# Patient Record
Sex: Female | Born: 1935 | Race: White | Hispanic: No | Marital: Married | Smoking: Never smoker
Health system: Southern US, Community
[De-identification: ages and names within clinical notes are randomized; demographics above are authoritative.]

## PROBLEM LIST (undated history)

## (undated) DIAGNOSIS — N393 Stress incontinence (female) (male): Secondary | ICD-10-CM

## (undated) DIAGNOSIS — F329 Major depressive disorder, single episode, unspecified: Secondary | ICD-10-CM

## (undated) DIAGNOSIS — N3941 Urge incontinence: Principal | ICD-10-CM

## (undated) DIAGNOSIS — F419 Anxiety disorder, unspecified: Secondary | ICD-10-CM

## (undated) DIAGNOSIS — I10 Essential (primary) hypertension: Secondary | ICD-10-CM

## (undated) DIAGNOSIS — H35379 Puckering of macula, unspecified eye: Secondary | ICD-10-CM

## (undated) DIAGNOSIS — F32A Depression, unspecified: Secondary | ICD-10-CM

## (undated) DIAGNOSIS — IMO0001 Reserved for inherently not codable concepts without codable children: Secondary | ICD-10-CM

## (undated) DIAGNOSIS — A319 Mycobacterial infection, unspecified: Secondary | ICD-10-CM

## (undated) DIAGNOSIS — R51 Headache: Secondary | ICD-10-CM

## (undated) DIAGNOSIS — N8110 Cystocele, unspecified: Secondary | ICD-10-CM

## (undated) DIAGNOSIS — E785 Hyperlipidemia, unspecified: Secondary | ICD-10-CM

## (undated) DIAGNOSIS — Z86010 Personal history of colonic polyps: Secondary | ICD-10-CM

## (undated) DIAGNOSIS — K219 Gastro-esophageal reflux disease without esophagitis: Secondary | ICD-10-CM

## (undated) DIAGNOSIS — N952 Postmenopausal atrophic vaginitis: Secondary | ICD-10-CM

## (undated) DIAGNOSIS — K449 Diaphragmatic hernia without obstruction or gangrene: Secondary | ICD-10-CM

## (undated) DIAGNOSIS — K5730 Diverticulosis of large intestine without perforation or abscess without bleeding: Secondary | ICD-10-CM

## (undated) HISTORY — DX: Anxiety disorder, unspecified: F41.9

## (undated) HISTORY — DX: Stress incontinence (female) (male): N39.3

## (undated) HISTORY — PX: COLONOSCOPY: SHX174

## (undated) HISTORY — PX: OTHER SURGICAL HISTORY: SHX169

## (undated) HISTORY — DX: Hyperlipidemia, unspecified: E78.5

## (undated) HISTORY — PX: CATARACT EXTRACTION, BILATERAL: SHX1313

## (undated) HISTORY — DX: Depression, unspecified: F32.A

## (undated) HISTORY — DX: Personal history of colonic polyps: Z86.010

## (undated) HISTORY — DX: Urge incontinence: N39.41

## (undated) HISTORY — PX: ESOPHAGOGASTRODUODENOSCOPY: SHX1529

## (undated) HISTORY — DX: Reserved for inherently not codable concepts without codable children: IMO0001

## (undated) HISTORY — PX: REPLACEMENT TOTAL KNEE: SUR1224

## (undated) HISTORY — DX: Diaphragmatic hernia without obstruction or gangrene: K44.9

## (undated) HISTORY — DX: Postmenopausal atrophic vaginitis: N95.2

## (undated) HISTORY — DX: Mycobacterial infection, unspecified: A31.9

## (undated) HISTORY — DX: Diverticulosis of large intestine without perforation or abscess without bleeding: K57.30

## (undated) HISTORY — DX: Major depressive disorder, single episode, unspecified: F32.9

## (undated) HISTORY — DX: Headache: R51

## (undated) HISTORY — DX: Puckering of macula, unspecified eye: H35.379

## (undated) HISTORY — DX: Essential (primary) hypertension: I10

## (undated) HISTORY — PX: BREAST SURGERY: SHX581

## (undated) HISTORY — DX: Gastro-esophageal reflux disease without esophagitis: K21.9

## (undated) HISTORY — PX: KNEE SURGERY: SHX244

## (undated) HISTORY — PX: APPENDECTOMY: SHX54

## (undated) HISTORY — PX: TOTAL ABDOMINAL HYSTERECTOMY W/ BILATERAL SALPINGOOPHORECTOMY: SHX83

## (undated) HISTORY — DX: Cystocele, unspecified: N81.10

---

## 1998-05-19 ENCOUNTER — Other Ambulatory Visit: Admission: RE | Admit: 1998-05-19 | Discharge: 1998-05-19 | Payer: Self-pay | Admitting: Obstetrics & Gynecology

## 1998-10-06 ENCOUNTER — Emergency Department (HOSPITAL_COMMUNITY): Admission: EM | Admit: 1998-10-06 | Discharge: 1998-10-06 | Payer: Self-pay | Admitting: Emergency Medicine

## 1999-07-19 ENCOUNTER — Other Ambulatory Visit: Admission: RE | Admit: 1999-07-19 | Discharge: 1999-07-19 | Payer: Self-pay | Admitting: Obstetrics & Gynecology

## 1999-10-05 ENCOUNTER — Ambulatory Visit (HOSPITAL_COMMUNITY): Admission: RE | Admit: 1999-10-05 | Discharge: 1999-10-05 | Payer: Self-pay | Admitting: Gastroenterology

## 1999-10-05 ENCOUNTER — Encounter: Payer: Self-pay | Admitting: Gastroenterology

## 2000-07-25 ENCOUNTER — Other Ambulatory Visit: Admission: RE | Admit: 2000-07-25 | Discharge: 2000-07-25 | Payer: Self-pay | Admitting: Obstetrics & Gynecology

## 2001-05-18 ENCOUNTER — Emergency Department (HOSPITAL_COMMUNITY): Admission: EM | Admit: 2001-05-18 | Discharge: 2001-05-18 | Payer: Self-pay

## 2001-09-02 ENCOUNTER — Other Ambulatory Visit: Admission: RE | Admit: 2001-09-02 | Discharge: 2001-09-02 | Payer: Self-pay | Admitting: Obstetrics & Gynecology

## 2002-08-01 ENCOUNTER — Ambulatory Visit (HOSPITAL_COMMUNITY): Admission: RE | Admit: 2002-08-01 | Discharge: 2002-08-01 | Payer: Self-pay | Admitting: Orthopedic Surgery

## 2002-08-08 ENCOUNTER — Ambulatory Visit (HOSPITAL_COMMUNITY): Admission: RE | Admit: 2002-08-08 | Discharge: 2002-08-08 | Payer: Self-pay | Admitting: Internal Medicine

## 2002-08-08 ENCOUNTER — Encounter: Payer: Self-pay | Admitting: Internal Medicine

## 2002-09-19 ENCOUNTER — Other Ambulatory Visit: Admission: RE | Admit: 2002-09-19 | Discharge: 2002-09-19 | Payer: Self-pay | Admitting: Obstetrics & Gynecology

## 2003-04-02 ENCOUNTER — Ambulatory Visit (HOSPITAL_BASED_OUTPATIENT_CLINIC_OR_DEPARTMENT_OTHER): Admission: RE | Admit: 2003-04-02 | Discharge: 2003-04-02 | Payer: Self-pay | Admitting: Plastic Surgery

## 2003-04-02 ENCOUNTER — Encounter (INDEPENDENT_AMBULATORY_CARE_PROVIDER_SITE_OTHER): Payer: Self-pay | Admitting: Plastic Surgery

## 2003-04-02 ENCOUNTER — Ambulatory Visit (HOSPITAL_COMMUNITY): Admission: RE | Admit: 2003-04-02 | Discharge: 2003-04-02 | Payer: Self-pay | Admitting: Plastic Surgery

## 2004-02-05 ENCOUNTER — Other Ambulatory Visit: Admission: RE | Admit: 2004-02-05 | Discharge: 2004-02-05 | Payer: Self-pay | Admitting: Obstetrics & Gynecology

## 2004-05-19 ENCOUNTER — Ambulatory Visit: Payer: Self-pay | Admitting: Internal Medicine

## 2004-05-20 ENCOUNTER — Encounter: Admission: RE | Admit: 2004-05-20 | Discharge: 2004-05-20 | Payer: Self-pay | Admitting: Internal Medicine

## 2004-07-29 ENCOUNTER — Ambulatory Visit: Payer: Self-pay | Admitting: Internal Medicine

## 2004-08-05 ENCOUNTER — Ambulatory Visit: Payer: Self-pay | Admitting: Internal Medicine

## 2004-10-25 ENCOUNTER — Ambulatory Visit: Payer: Self-pay | Admitting: Gastroenterology

## 2004-11-01 ENCOUNTER — Ambulatory Visit: Payer: Self-pay | Admitting: Gastroenterology

## 2004-11-23 ENCOUNTER — Encounter (INDEPENDENT_AMBULATORY_CARE_PROVIDER_SITE_OTHER): Payer: Self-pay | Admitting: *Deleted

## 2004-11-23 ENCOUNTER — Ambulatory Visit: Payer: Self-pay | Admitting: Gastroenterology

## 2004-12-02 ENCOUNTER — Ambulatory Visit: Payer: Self-pay | Admitting: Internal Medicine

## 2004-12-30 ENCOUNTER — Ambulatory Visit: Payer: Self-pay | Admitting: Internal Medicine

## 2005-01-13 ENCOUNTER — Ambulatory Visit: Payer: Self-pay | Admitting: Internal Medicine

## 2005-02-27 ENCOUNTER — Ambulatory Visit: Payer: Self-pay | Admitting: Internal Medicine

## 2005-03-09 ENCOUNTER — Ambulatory Visit: Payer: Self-pay | Admitting: Internal Medicine

## 2005-03-28 ENCOUNTER — Encounter: Admission: RE | Admit: 2005-03-28 | Discharge: 2005-03-28 | Payer: Self-pay | Admitting: Specialist

## 2005-03-30 ENCOUNTER — Ambulatory Visit: Payer: Self-pay | Admitting: Internal Medicine

## 2005-05-30 ENCOUNTER — Ambulatory Visit: Payer: Self-pay | Admitting: Internal Medicine

## 2005-06-07 ENCOUNTER — Ambulatory Visit: Payer: Self-pay

## 2005-06-07 ENCOUNTER — Encounter: Payer: Self-pay | Admitting: Cardiology

## 2005-10-04 ENCOUNTER — Ambulatory Visit: Payer: Self-pay | Admitting: Internal Medicine

## 2006-02-05 ENCOUNTER — Ambulatory Visit: Payer: Self-pay | Admitting: Internal Medicine

## 2006-03-27 ENCOUNTER — Ambulatory Visit: Payer: Self-pay | Admitting: Internal Medicine

## 2006-07-25 ENCOUNTER — Ambulatory Visit: Payer: Self-pay | Admitting: Internal Medicine

## 2006-11-20 ENCOUNTER — Ambulatory Visit: Payer: Self-pay | Admitting: Internal Medicine

## 2007-02-14 ENCOUNTER — Telehealth (INDEPENDENT_AMBULATORY_CARE_PROVIDER_SITE_OTHER): Payer: Self-pay | Admitting: *Deleted

## 2007-02-19 DIAGNOSIS — J4520 Mild intermittent asthma, uncomplicated: Secondary | ICD-10-CM | POA: Insufficient documentation

## 2007-02-19 DIAGNOSIS — F329 Major depressive disorder, single episode, unspecified: Secondary | ICD-10-CM | POA: Insufficient documentation

## 2007-02-19 DIAGNOSIS — E785 Hyperlipidemia, unspecified: Secondary | ICD-10-CM

## 2007-02-19 DIAGNOSIS — R519 Headache, unspecified: Secondary | ICD-10-CM | POA: Insufficient documentation

## 2007-02-19 DIAGNOSIS — F411 Generalized anxiety disorder: Secondary | ICD-10-CM | POA: Insufficient documentation

## 2007-02-19 DIAGNOSIS — R51 Headache: Secondary | ICD-10-CM

## 2007-02-22 ENCOUNTER — Ambulatory Visit: Payer: Self-pay | Admitting: Internal Medicine

## 2007-02-22 DIAGNOSIS — IMO0001 Reserved for inherently not codable concepts without codable children: Secondary | ICD-10-CM

## 2007-02-25 LAB — CONVERTED CEMR LAB
ALT: 26 units/L (ref 0–35)
AST: 33 units/L (ref 0–37)
Albumin: 4.1 g/dL (ref 3.5–5.2)
Alkaline Phosphatase: 68 units/L (ref 39–117)
Basophils Absolute: 0 10*3/uL (ref 0.0–0.1)
Basophils Relative: 0.5 % (ref 0.0–1.0)
Bilirubin, Direct: 0.1 mg/dL (ref 0.0–0.3)
Cholesterol: 208 mg/dL (ref 0–200)
Direct LDL: 127.7 mg/dL
Eosinophils Absolute: 0.4 10*3/uL (ref 0.0–0.6)
Eosinophils Relative: 5.5 % — ABNORMAL HIGH (ref 0.0–5.0)
Ferritin: 63.8 ng/mL (ref 10.0–291.0)
HCT: 42.1 % (ref 36.0–46.0)
HDL: 70.9 mg/dL (ref >39.0)
Hemoglobin: 14.5 g/dL (ref 12.0–15.0)
Iron: 108 ug/dL (ref 42–145)
Lymphocytes Relative: 27 % (ref 12.0–46.0)
MCHC: 34.4 g/dL (ref 30.0–36.0)
MCV: 91 fL (ref 78.0–100.0)
Monocytes Absolute: 0.6 10*3/uL (ref 0.2–0.7)
Monocytes Relative: 8 % (ref 3.0–11.0)
Neutro Abs: 4 10*3/uL (ref 1.4–7.7)
Neutrophils Relative %: 59 % (ref 43.0–77.0)
Platelets: 303 10*3/uL (ref 150–400)
RBC: 4.62 M/uL (ref 3.87–5.11)
RDW: 12.7 % (ref 11.5–14.6)
Total Bilirubin: 0.8 mg/dL (ref 0.3–1.2)
Total CHOL/HDL Ratio: 2.9
Total Protein: 6.4 g/dL (ref 6.0–8.3)
Transferrin: 318 mg/dL (ref >=212.0)
Triglycerides: 103 mg/dL (ref 0–149)
VLDL: 21 mg/dL (ref 0–40)
WBC: 6.9 10*3/uL (ref 4.5–10.5)

## 2007-03-15 ENCOUNTER — Telehealth (INDEPENDENT_AMBULATORY_CARE_PROVIDER_SITE_OTHER): Payer: Self-pay | Admitting: *Deleted

## 2007-04-08 ENCOUNTER — Telehealth: Payer: Self-pay | Admitting: Internal Medicine

## 2007-06-17 ENCOUNTER — Telehealth: Payer: Self-pay | Admitting: Internal Medicine

## 2007-06-25 ENCOUNTER — Ambulatory Visit: Payer: Self-pay | Admitting: Internal Medicine

## 2007-06-27 DIAGNOSIS — I10 Essential (primary) hypertension: Secondary | ICD-10-CM | POA: Insufficient documentation

## 2007-07-08 ENCOUNTER — Telehealth: Payer: Self-pay | Admitting: Internal Medicine

## 2007-07-10 ENCOUNTER — Ambulatory Visit: Payer: Self-pay | Admitting: Gastroenterology

## 2007-07-18 ENCOUNTER — Encounter: Payer: Self-pay | Admitting: Internal Medicine

## 2007-07-26 ENCOUNTER — Ambulatory Visit: Payer: Self-pay | Admitting: Internal Medicine

## 2007-08-09 ENCOUNTER — Telehealth: Payer: Self-pay | Admitting: Internal Medicine

## 2007-08-19 ENCOUNTER — Encounter: Payer: Self-pay | Admitting: Internal Medicine

## 2007-08-19 ENCOUNTER — Ambulatory Visit: Payer: Self-pay | Admitting: Gastroenterology

## 2007-08-19 ENCOUNTER — Encounter: Payer: Self-pay | Admitting: Gastroenterology

## 2007-08-19 DIAGNOSIS — Z860100 Personal history of colon polyps, unspecified: Secondary | ICD-10-CM

## 2007-08-19 DIAGNOSIS — K449 Diaphragmatic hernia without obstruction or gangrene: Secondary | ICD-10-CM

## 2007-08-19 DIAGNOSIS — K5730 Diverticulosis of large intestine without perforation or abscess without bleeding: Secondary | ICD-10-CM

## 2007-08-19 HISTORY — DX: Personal history of colonic polyps: Z86.010

## 2007-08-19 HISTORY — DX: Diaphragmatic hernia without obstruction or gangrene: K44.9

## 2007-08-19 HISTORY — DX: Diverticulosis of large intestine without perforation or abscess without bleeding: K57.30

## 2007-08-19 HISTORY — DX: Personal history of colon polyps, unspecified: Z86.0100

## 2007-09-18 ENCOUNTER — Encounter: Payer: Self-pay | Admitting: Internal Medicine

## 2007-09-24 ENCOUNTER — Ambulatory Visit: Payer: Self-pay | Admitting: Gastroenterology

## 2007-10-23 ENCOUNTER — Ambulatory Visit: Payer: Self-pay | Admitting: Internal Medicine

## 2008-01-24 ENCOUNTER — Encounter: Payer: Self-pay | Admitting: Internal Medicine

## 2008-03-02 ENCOUNTER — Telehealth: Payer: Self-pay | Admitting: Gastroenterology

## 2008-03-10 ENCOUNTER — Ambulatory Visit: Payer: Self-pay | Admitting: Internal Medicine

## 2008-04-21 ENCOUNTER — Ambulatory Visit: Payer: Self-pay | Admitting: Internal Medicine

## 2008-05-29 ENCOUNTER — Ambulatory Visit: Payer: Self-pay | Admitting: Internal Medicine

## 2008-06-23 ENCOUNTER — Telehealth: Payer: Self-pay | Admitting: Internal Medicine

## 2008-06-26 ENCOUNTER — Ambulatory Visit: Payer: Self-pay | Admitting: Internal Medicine

## 2008-06-30 ENCOUNTER — Telehealth: Payer: Self-pay | Admitting: Internal Medicine

## 2008-07-07 ENCOUNTER — Encounter: Payer: Self-pay | Admitting: Gastroenterology

## 2008-07-07 ENCOUNTER — Telehealth (INDEPENDENT_AMBULATORY_CARE_PROVIDER_SITE_OTHER): Payer: Self-pay | Admitting: *Deleted

## 2008-07-27 ENCOUNTER — Telehealth: Payer: Self-pay | Admitting: Internal Medicine

## 2008-08-11 ENCOUNTER — Telehealth: Payer: Self-pay | Admitting: Gastroenterology

## 2008-08-18 ENCOUNTER — Encounter: Payer: Self-pay | Admitting: Gastroenterology

## 2008-10-05 ENCOUNTER — Telehealth: Payer: Self-pay | Admitting: Internal Medicine

## 2008-10-06 ENCOUNTER — Telehealth: Payer: Self-pay | Admitting: Internal Medicine

## 2009-01-14 ENCOUNTER — Encounter: Payer: Self-pay | Admitting: Internal Medicine

## 2009-02-02 ENCOUNTER — Encounter: Payer: Self-pay | Admitting: Gastroenterology

## 2009-02-10 ENCOUNTER — Encounter: Payer: Self-pay | Admitting: Internal Medicine

## 2009-04-14 ENCOUNTER — Ambulatory Visit: Payer: Self-pay | Admitting: Internal Medicine

## 2009-04-15 ENCOUNTER — Telehealth: Payer: Self-pay | Admitting: Internal Medicine

## 2009-04-22 ENCOUNTER — Encounter (INDEPENDENT_AMBULATORY_CARE_PROVIDER_SITE_OTHER): Payer: Self-pay | Admitting: *Deleted

## 2009-06-07 ENCOUNTER — Telehealth: Payer: Self-pay | Admitting: Internal Medicine

## 2009-06-10 ENCOUNTER — Encounter: Payer: Self-pay | Admitting: Internal Medicine

## 2009-06-22 ENCOUNTER — Encounter: Payer: Self-pay | Admitting: Internal Medicine

## 2009-08-13 ENCOUNTER — Ambulatory Visit (HOSPITAL_COMMUNITY): Admission: RE | Admit: 2009-08-13 | Discharge: 2009-08-13 | Payer: Self-pay | Admitting: Orthopedic Surgery

## 2009-09-08 ENCOUNTER — Telehealth: Payer: Self-pay | Admitting: Internal Medicine

## 2009-12-08 ENCOUNTER — Ambulatory Visit: Payer: Self-pay | Admitting: Internal Medicine

## 2009-12-10 LAB — CONVERTED CEMR LAB
BUN: 14 mg/dL (ref 6–23)
Basophils Relative: 0.8 % (ref 0.0–3.0)
Chloride: 109 meq/L (ref 96–112)
Eosinophils Absolute: 0.5 10*3/uL (ref 0.0–0.7)
Glucose, Bld: 109 mg/dL — ABNORMAL HIGH (ref 70–99)
HCT: 41.5 % (ref 36.0–46.0)
Hemoglobin: 14.3 g/dL (ref 12.0–15.0)
LDL Cholesterol: 90 mg/dL (ref 0–99)
Lymphocytes Relative: 22.7 % (ref 12.0–46.0)
MCHC: 34.4 g/dL (ref 30.0–36.0)
Neutro Abs: 4.6 10*3/uL (ref 1.4–7.7)
Potassium: 5.5 meq/L — ABNORMAL HIGH (ref 3.5–5.1)
RBC: 4.44 M/uL (ref 3.87–5.11)
TSH: 0.77 microintl units/mL (ref 0.35–5.50)
Total Bilirubin: 0.5 mg/dL (ref 0.3–1.2)
VLDL: 31.6 mg/dL (ref 0.0–40.0)

## 2009-12-17 ENCOUNTER — Ambulatory Visit: Payer: Self-pay | Admitting: Internal Medicine

## 2009-12-21 LAB — CONVERTED CEMR LAB
CO2: 27 meq/L (ref 19–32)
Glucose, Bld: 101 mg/dL — ABNORMAL HIGH (ref 70–99)
Potassium: 4.9 meq/L (ref 3.5–5.1)
Sodium: 143 meq/L (ref 135–145)

## 2010-01-13 ENCOUNTER — Encounter: Payer: Self-pay | Admitting: Gastroenterology

## 2010-01-27 ENCOUNTER — Telehealth: Payer: Self-pay | Admitting: Internal Medicine

## 2010-01-28 ENCOUNTER — Telehealth: Payer: Self-pay | Admitting: *Deleted

## 2010-02-01 ENCOUNTER — Telehealth: Payer: Self-pay | Admitting: Internal Medicine

## 2010-02-01 ENCOUNTER — Ambulatory Visit: Payer: Self-pay | Admitting: Family Medicine

## 2010-02-02 ENCOUNTER — Telehealth (INDEPENDENT_AMBULATORY_CARE_PROVIDER_SITE_OTHER): Payer: Self-pay | Admitting: *Deleted

## 2010-02-02 LAB — CONVERTED CEMR LAB
Albumin: 4.3 g/dL (ref 3.5–5.2)
BUN: 11 mg/dL (ref 6–23)
CO2: 28 meq/L (ref 19–32)
Chloride: 106 meq/L (ref 96–112)
GFR calc non Af Amer: 52.98 mL/min (ref >60)

## 2010-02-07 ENCOUNTER — Telehealth: Payer: Self-pay | Admitting: Internal Medicine

## 2010-02-11 ENCOUNTER — Ambulatory Visit: Payer: Self-pay | Admitting: Internal Medicine

## 2010-02-11 DIAGNOSIS — A319 Mycobacterial infection, unspecified: Secondary | ICD-10-CM | POA: Insufficient documentation

## 2010-02-17 ENCOUNTER — Encounter: Payer: Self-pay | Admitting: Internal Medicine

## 2010-03-31 ENCOUNTER — Encounter
Admission: RE | Admit: 2010-03-31 | Discharge: 2010-03-31 | Payer: Self-pay | Admitting: Physical Medicine and Rehabilitation

## 2010-04-06 ENCOUNTER — Telehealth: Payer: Self-pay | Admitting: Internal Medicine

## 2010-04-22 ENCOUNTER — Encounter
Admission: RE | Admit: 2010-04-22 | Discharge: 2010-04-22 | Payer: Self-pay | Admitting: Physical Medicine and Rehabilitation

## 2010-05-02 ENCOUNTER — Ambulatory Visit: Payer: Self-pay | Admitting: Internal Medicine

## 2010-05-18 ENCOUNTER — Telehealth: Payer: Self-pay | Admitting: Internal Medicine

## 2010-05-19 ENCOUNTER — Ambulatory Visit: Payer: Self-pay | Admitting: Internal Medicine

## 2010-05-23 ENCOUNTER — Encounter
Admission: RE | Admit: 2010-05-23 | Discharge: 2010-05-23 | Payer: Self-pay | Admitting: Physical Medicine and Rehabilitation

## 2010-05-31 ENCOUNTER — Telehealth: Payer: Self-pay | Admitting: Internal Medicine

## 2010-06-09 ENCOUNTER — Encounter: Payer: Self-pay | Admitting: Internal Medicine

## 2010-06-10 ENCOUNTER — Telehealth: Payer: Self-pay | Admitting: Internal Medicine

## 2010-06-15 ENCOUNTER — Telehealth: Payer: Self-pay | Admitting: Gastroenterology

## 2010-06-28 ENCOUNTER — Telehealth: Payer: Self-pay | Admitting: Internal Medicine

## 2010-07-01 ENCOUNTER — Ambulatory Visit
Admission: RE | Admit: 2010-07-01 | Discharge: 2010-07-01 | Payer: Self-pay | Source: Home / Self Care | Attending: Gastroenterology | Admitting: Gastroenterology

## 2010-07-01 DIAGNOSIS — D126 Benign neoplasm of colon, unspecified: Secondary | ICD-10-CM | POA: Insufficient documentation

## 2010-07-01 DIAGNOSIS — K589 Irritable bowel syndrome without diarrhea: Secondary | ICD-10-CM | POA: Insufficient documentation

## 2010-07-01 DIAGNOSIS — K219 Gastro-esophageal reflux disease without esophagitis: Secondary | ICD-10-CM | POA: Insufficient documentation

## 2010-07-09 ENCOUNTER — Encounter: Payer: Self-pay | Admitting: Physical Medicine and Rehabilitation

## 2010-07-19 NOTE — Miscellaneous (Signed)
Summary: Refill Dexilant  Clinical Lists Changes  Medications: Changed medication from KAPIDEX 60 MG CPDR (DEXLANSOPRAZOLE) Take 1 tablet by mouth once a day to DEXILANT 60 MG CPDR (DEXLANSOPRAZOLE) 1 cap 30 min before breakfast - Signed Rx of DEXILANT 60 MG CPDR (DEXLANSOPRAZOLE) 1 cap 30 min before breakfast;  #30 x 3;  Signed;  Entered by: Ashok Cordia RN;  Authorized by: Mardella Layman MD [facility];  Method used: Electronically to [facility] [address] [place], [place]  [id number], Ph: [phone], Fax: [phone]    Prescriptions: DEXILANT 60 MG CPDR (DEXLANSOPRAZOLE) 1 cap 30 min before breakfast  #30 x 3   Entered by:   Ashok Cordia RN   Authorized by:   Mardella Layman MD [facility]   Signed by:   Ashok Cordia RN on 01/13/2010   Method used:   Electronically to        [facility]* (retail)       [address]       [place], [place]  [address]       Ph: [phone]       Fax: [phone]   RxID:   [id number]

## 2010-07-19 NOTE — Letter (Signed)
Summary: [facility]  [facility]   Imported By: Maryln Gottron 07/22/2009 13:57:54  _____________________________________________________________________  External Attachment:    Type:   Image     Comment:   External Document

## 2010-07-19 NOTE — Progress Notes (Signed)
Summary: Pt is needing help with modifying meds to match bacteria med  Phone Note Call from Patient Call back at [phone] cell   Caller: spouse - Maisie Fus Summary of Call: Pt is on a med for bacteria and she is having problems modifying other meds to match the bacteria med. Pls call asap.  Initial call taken by: Lucy Antigua,  April 06, 2010 1:54 PM

## 2010-07-19 NOTE — Progress Notes (Signed)
Summary: refills on zolpidem, bupropion, lyrica and advair to [facility] Note From Pharmacy   Caller: [facility] Reason for Call: Needs renewal Details for Reason: zolpidem, bupropion, lyrica and advair Initial call taken by: Romualdo Bolk, CMA Duncan Dull),  January 28, 2010 8:51 AM  Follow-up for Phone Call        ok  Additional Follow-up for Phone Call Additional follow up Details #1::        Rx faxed to pharmacy Additional Follow-up by: Romualdo Bolk, CMA Duncan Dull),  January 28, 2010 9:39 AM    Prescriptions: LYRICA 150 MG CAPS (PREGABALIN) take one tab two times a day  #180 x 3   Entered by:   Romualdo Bolk, CMA (AAMA)   Authorized by:   Birdie Sons MD   Signed by:   Romualdo Bolk, CMA (AAMA) on 01/28/2010   Method used:   Printed then faxed to ...       [facility]* (retail)             ,          Ph: [phone]       Fax: [phone]   RxID:   [id number] BUDEPRION SR 150 MG TB12 (BUPROPION HCL) Take 1 tablet by mouth once a day  #90 x 3   Entered by:   Romualdo Bolk, CMA (AAMA)   Authorized by:   Birdie Sons MD   Signed by:   Romualdo Bolk, CMA (AAMA) on 01/28/2010   Method used:   Printed then faxed to ...       [facility]* (retail)             ,          Ph: [phone]       Fax: [phone]   RxID:   [id number] ADVAIR DISKUS 250-50 MCG/DOSE AEPB (FLUTICASONE-SALMETEROL) one puff two times a day as directed  #3 x 3   Entered by:   Romualdo Bolk, CMA (AAMA)   Authorized by:   Birdie Sons MD   Signed by:   Romualdo Bolk, CMA (AAMA) on 01/28/2010   Method used:   Printed then faxed to ...       [facility]* (retail)             ,          Ph: [phone]       Fax: [phone]   RxID:   [id number] AMBIEN 10 MG TABS (ZOLPIDEM TARTRATE) Take 1 tablet by mouth every night  #90 x 1   Entered by:   Romualdo Bolk, CMA (AAMA)   Authorized by:   Birdie Sons MD   Signed by:   Romualdo Bolk, CMA  (AAMA) on 01/28/2010   Method used:   Printed then faxed to ...       [facility]* (retail)             ,          Ph: [phone]       Fax: [phone]   RxID:   [id number]

## 2010-07-19 NOTE — Progress Notes (Signed)
Summary: REFILL  Phone Note Refill Request Message from:  Fax from Pharmacy  Refills Requested: Medication #1:  BUDEPRION SR 150 MG TB12 Take 1 tablet by mouth once a day [facility] FAX----[phone]  Initial call taken by: Warnell Forester,  September 08, 2009 9:08 AM    Prescriptions: BUDEPRION SR 150 MG TB12 (BUPROPION HCL) Take 1 tablet by mouth once a day  #90 x 0   Entered by:   Kern Reap CMA (AAMA)   Authorized by:   Birdie Sons MD   Signed by:   Kern Reap CMA (AAMA) on 09/08/2009   Method used:   Electronically to        [facility]* (mail-order)             ,          Ph: [phone]       Fax: [phone]   RxID:   [id number]

## 2010-07-19 NOTE — Assessment & Plan Note (Signed)
Summary: 6 MTH ROV // RS   Vital Signs:  Patient profile:   75 year old female Weight:      145 pounds BMI:     26.62 Temp:     97.6 degrees F oral Pulse rate:   82 / minute Pulse rhythm:   regular Resp:     16 per minute BP sitting:   120 / 72  Vitals Entered By: Lynann Beaver CMA (February 11, 2010 11:56 AM) CC: rov Is Patient Diabetic? No Pain Assessment Patient in pain? yes        CC:  rov.  History of Present Illness: Compicated foot surgery with infection---see notes from [facility] bacteria identified--on clarithromycin  she noted marked edema last week--i talked with her on the phone and with leg elevation sxs have resolved  All other systems reviewed and were negative except for chronic fatigue and fibromyalgia  She is quite frustrated with the entire experience related to complications of foot surgery  All other systems reviewed and were negative   Current Medications (verified): 1)  Advair Diskus 250-50 Mcg/dose Aepb (Fluticasone-Salmeterol) .... One Puff Daily 2)  Ambien 10 Mg Tabs (Zolpidem Tartrate) .... Take 1 Tablet By Mouth Every Night 3)  Premarin 0.625 Mg Tabs (Estrogens Conjugated) .... One By Mouth Daily 4)  Vitamin C 500 Mg Tabs (Ascorbic Acid) .... Take 5)  B Complex 100   Tabs (B Complex Vitamins) .... Once Daily 6)  Amlodipine Besylate 5 Mg  Tabs (Amlodipine Besylate) .... Take 1 Tablet By Mouth Once A Day 7)  Dexilant 60 Mg Cpdr (Dexlansoprazole) .Marland Kitchen.. 1 Cap 30 Min Before Breakfast 8)  Budeprion Sr 150 Mg Tb12 (Bupropion Hcl) .... Take 1 Tablet By Mouth Once A Day 9)  Lyrica 100 Mg Caps (Pregabalin) .... 2 Q Hs 10)  Boniva 150 Mg Tabs (Ibandronate Sodium) .... Take One Every Month As Directed 11)  Advil 200 Mg Caps (Ibuprofen) .... One By Mouth Three Times A Day 12)  Vitamin D3 1000 Unit Caps (Cholecalciferol) .... One By Mouth Daily 13)  Vitamin B-1 100 Mg Tabs (Thiamine Hcl) .... One By Mouth Daily 14)  Cod Liver Oil  Caps (Cod Liver Oil) ....  One By Mouth Daily 15)  Furosemide 20 Mg Tabs (Furosemide) .... One By Mouth Daily 16)  Ferrous Sulfate 325 (65 Fe) Mg Tabs (Ferrous Sulfate) .... One By Mouth Daily 17)  Clarithromycin 500 Mg Tabs (Clarithromycin) .... One By Mouth Bid  Allergies (verified): 1)  ! Lisinopril (Lisinopril)  Physical Exam  General:  alert and well-developed.   Head:  normocephalic and atraumatic.   Eyes:  pupils equal and pupils round.   Ears:  R ear normal and L ear normal.   Neck:  No deformities, masses, or tenderness noted. Chest Wall:  No deformities, masses, or tenderness noted. Lungs:  normal respiratory effort and no intercostal retractions.   Heart:  normal rate and regular rhythm.   Abdomen:  soft and non-tender.   Msk:  No deformity or scoliosis noted of thoracic or lumbar spine.   bandage on operative foot Neurologic:  cranial nerves II-XII intact.     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 She has required multiple procedures. she now seems to be getting some better. Is on long-term antibiotics.  Problem # 2:  PEDAL EDEMA (ICD-782.3)  resolved. Her updated medication list for this problem includes     Furosemide 20 Mg Tabs (Furosemide) .Marland KitchenMarland KitchenMarland KitchenMarland Kitchen  One by mouth daily  Problem # 3:  MYALGIA/MYOSITIS NOS (ICD-729.1)  exacerbation of fibromyalgia has been a complication of her prolonged infection and surgical recovery. she thinks she might be doing some better in the past week. Her updated medication list for this problem includes:    Advil 200 Mg Caps (Ibuprofen) ..... One by mouth three times a day    Tramadol Hcl 50 Mg Tabs (Tramadol hcl) .Marland Kitchen... Take 1 tablet by mouth three times a day as needed pain  Problem # 4:  DEPRESSION (ICD-311)  exacerbated by complicated surgical procedures. she will continue current medications. Will not change medications at this time. Her updated medication list for this  problem includes:    Budeprion Sr 150 Mg Tb12 (Bupropion hcl) .Marland Kitchen... Take 1 tablet by mouth once a day  Complete Medication List: 1)  Advair Diskus 250-50 Mcg/dose Aepb (Fluticasone-salmeterol) .... One puff daily 2)  Ambien 10 Mg Tabs (Zolpidem tartrate) .... Take 1 tablet by mouth every night 3)  Premarin 0.625 Mg Tabs (Estrogens conjugated) .... One by mouth daily 4)  Vitamin C 500 Mg Tabs (Ascorbic acid) .... Take 5)  B Complex 100 Tabs (B complex vitamins) .... Once daily 6)  Amlodipine Besylate 5 Mg Tabs (Amlodipine besylate) .... Take 1 tablet by mouth once a day 7)  Dexilant 60 Mg Cpdr (Dexlansoprazole) .Marland Kitchen.. 1 cap 30 min before breakfast 8)  Budeprion Sr 150 Mg Tb12 (Bupropion hcl) .... Take 1 tablet by mouth once a day 9)  Lyrica 100 Mg Caps (Pregabalin) .... 2 q hs 10)  Boniva 150 Mg Tabs (Ibandronate sodium) .... Take one every month as directed 11)  Advil 200 Mg Caps (Ibuprofen) .... One by mouth three times a day 12)  Vitamin D3 1000 Unit Caps (Cholecalciferol) .... One by mouth daily 13)  Vitamin B-1 100 Mg Tabs (Thiamine hcl) .... One by mouth daily 14)  Cod Liver Oil Caps (Cod liver oil) .... One by mouth daily 15)  Furosemide 20 Mg Tabs (Furosemide) .... One by mouth daily 16)  Ferrous Sulfate 325 (65 Fe) Mg Tabs (Ferrous sulfate) .... One by mouth daily 17)  Clarithromycin 500 Mg Tabs (Clarithromycin) .... One by mouth bid 18)  Tramadol Hcl 50 Mg Tabs (Tramadol hcl) .... Take 1 tablet by mouth three times a day as needed pain  Patient Instructions: 1)  . Prescriptions: TRAMADOL HCL 50 MG  TABS (TRAMADOL HCL) Take 1 tablet by mouth three times a day as needed pain  #40 x 1   Entered and Authorized by:   Birdie Sons MD   Signed by:   Birdie Sons MD on 02/11/2010   Method used:   Electronically to        [facility]* (retail)       [address]       [place], [place]  [id number]       Ph: [phone]       Fax: [phone]   RxID:    [id number]

## 2010-07-19 NOTE — Procedures (Signed)
Summary: Upper GI Endoscopy/[facility]   Upper GI [facility]   Imported By: Maryln Gottron 07/01/2009 14:22:24  _____________________________________________________________________  External Attachment:    Type:   Image     Comment:   External Document

## 2010-07-19 NOTE — Progress Notes (Signed)
Summary: wonderful  Phone Note Call from Patient   Caller: husband, Maisie Fus, [phone] Summary of Call: Prozac many years.  Doxy & Prozac potential interaction of heart arrythmia.  Off Prozac June.  Tolerated until recently.  [facility] infectious control Dr. Pleas Patricia  says she can go back on Prozac but would need periodic heart monitoring.  Husband says they are in [facility] & cannot get there now, but he can take her to ER here & have EKG run for adverse effects.  Wants Dr. Cato Mulligan to call. Wants Dr. Cato Mulligan to monitor & make sure nothing is missed.   Initial call taken by: Rudy Jew, RN,  April 06, 2010 3:34 PM  Follow-up for Phone Call        let them know I'll call them tonight Follow-up by: Birdie Sons MD,  April 06, 2010 3:52 PM  Additional Follow-up for Phone Call Additional follow up Details #1::        Mr. Fiorentino says wonderful.   Additional Follow-up by: Rudy Jew, RN,  April 06, 2010 4:08 PM

## 2010-07-19 NOTE — Medication Information (Signed)
Summary: Coverage Approval for [employer]   Coverage Approval for [employer]   Imported By: Maryln Gottron 06/21/2009 14:14:44  _____________________________________________________________________  External Attachment:    Type:   Image     Comment:   External Document

## 2010-07-19 NOTE — Progress Notes (Signed)
Summary: walkin for lab  Phone Note Call from Patient   Caller: walkin Summary of Call: Here saying the Dr. Pleas Patricia at [facility] said to have lab for her kidneys to see if that is causing the swelling, that the clindamycin 500mg  has nothing to do with swelling.   Dr. Abner Greenspan did order renal panel yesterday.  Lab checking.   Initial call taken by: Rudy Jew, RN,  February 02, 2010 1:37 PM  Follow-up for Phone Call        renal now available and completely normal. Please notify patient and her [facility] ID doctor, Dr Pleas Patricia Follow-up by: Danise Edge MD,  February 02, 2010 2:13 PM  Additional Follow-up for Phone Call Additional follow up Details #1::        I informed pt and sent a copy of labs to dr at [facility] Additional Follow-up by: Josph Macho RMA,  February 02, 2010 2:20 PM

## 2010-07-19 NOTE — Consult Note (Signed)
Summary: [facility]  [facility]   Imported By: Maryln Gottron 03/29/2010 14:11:42  _____________________________________________________________________  External Attachment:    Type:   Image     Comment:   External Document

## 2010-07-19 NOTE — Progress Notes (Signed)
Summary: wants to see Dr Cato Mulligan  Phone Note Call from Patient Call back at [phone]   Caller: Patient---triage vm Summary of Call: wants to see dr Kloie Whiting. having feet and leg swelling and bp is elevated. please return call.      [place]---[phone] Initial call taken by: Warnell Forester,  May 18, 2010 9:19 AM  Follow-up for Phone Call        pt's BP was 150/78 last night and 140/78 this morning.  Her legs are swelling and she is not currently on a diuretic.  Told pt that her BP was not critical to be seen today and could wait till tomorrow but would check with Dr Cato Mulligan  Follow-up by: Alfred Levins, CMA,  May 18, 2010 10:02 AM  Additional Follow-up for Phone Call Additional follow up Details #1::        per Dr Cato Mulligan ok to wait till tomorrow, appt scheduled Additional Follow-up by: Alfred Levins, CMA,  May 18, 2010 10:16 AM

## 2010-07-19 NOTE — Progress Notes (Signed)
Summary: Pharmacy call - Rx clarification  Phone Note From Pharmacy   Caller: [facility] Pharmacist Summary of Call: [facility] Pharmacist called with a question ref to Rx for med: Amlodipine...Marland KitchenMarland KitchenMarland Kitchen Adv that they can be reached at  [phone]  or  [phone], option 2.  Reference # [id number]  Initial call taken by: Debbra Riding,  January 27, 2010 11:25 AM  Follow-up for Phone Call        Another MD has put her on clrithromycin two times a day x 30 days and this can interact with amlodipine to potentiate it.  could cause hypotension and cardiac complications.  Wanted you aware. Follow-up by: Gladis Riffle, RN,  January 27, 2010 11:47 AM

## 2010-07-19 NOTE — Progress Notes (Signed)
Summary: Request for OV  Phone Note Call from Patient   Caller: Patient   [phone] Summary of Call: Pt called and left msg on Alvino Chapel, RN's v/m adv that she wanted to come in for an appt.Marland KitchenMarland KitchenMarland KitchenI called pt back and spoke with her, she adv she has bilateral foot edema that has worsened over the past week and seems to be going up into calf of leg... Pt was offered OV with another physician but declined stating that Dr Cato Mulligan knows her best and what has been going on with her and she wants to have OV with Dr Cato Mulligan.... Pt transferred to Triage for further.  Initial call taken by: Debbra Riding,  February 01, 2010 1:46 PM  Follow-up for Phone Call        Scheduled with Dr. Abner Greenspan. Follow-up by: Lynann Beaver CMA,  February 01, 2010 1:52 PM

## 2010-07-19 NOTE — Progress Notes (Signed)
  Phone Note Call from Patient   Summary of Call: i talked with pt yesterday about swelling please review our medications with exactly what she is currently taking---update med list to her current meds. I'll review and decide whether to change meds or see her [phone] Initial call taken by: Birdie Sons MD,  February 07, 2010 9:45 AM    New/Updated Medications: ADVAIR DISKUS 250-50 MCG/DOSE AEPB (FLUTICASONE-SALMETEROL) one puff daily LYRICA 100 MG CAPS (PREGABALIN) 2 q hs ADVIL 200 MG CAPS (IBUPROFEN) one by mouth three times a day VITAMIN D3 1000 UNIT CAPS (CHOLECALCIFEROL) one by mouth daily VITAMIN B-1 100 MG TABS (THIAMINE HCL) one by mouth daily COD LIVER OIL  CAPS (COD LIVER OIL) one by mouth daily FUROSEMIDE 20 MG TABS (FUROSEMIDE) one by mouth daily FERROUS SULFATE 325 (65 FE) MG TABS (FERROUS SULFATE) one by mouth daily  Appended Document:  Meds updated.

## 2010-07-19 NOTE — Assessment & Plan Note (Signed)
Summary: F/U ON MEDS // RS   Vital Signs:  Patient profile:   75 year old female Weight:      142 pounds Temp:     97.7 degrees F oral Pulse rate:   64 / minute Pulse rhythm:   regular BP sitting:   122 / 74  (left arm) Cuff size:   regular  Vitals Entered By: Sydell Axon LPN (May 02, 2010 8:30 AM) CC: follow-up visit on meds, has a sore area under right chin, had a flu shot about 3 weeks ago   CC:  follow-up visit on meds, has a sore area under right chin, and had a flu shot about 3 weeks ago.  Current Problems (verified): 1)  Atypical Mycobacterial Infection  (ICD-031.9) 2)  Pedal Edema  (ICD-782.3) 3)  Hyperkalemia  (ICD-276.7) 4)  Foot Pain  (ICD-729.5) 5)  Hypertension  (ICD-401.9) 6)  Myalgia/myositis Nos  (ICD-729.1) 7)  Hyperlipidemia  (ICD-272.4) 8)  Headache  (ICD-784.0) 9)  Depression  (ICD-311) 10)  Asthma  (ICD-493.90) 11)  Anxiety  (ICD-300.00)  Current Medications (verified): 1)  Advair Diskus 250-50 Mcg/dose Aepb (Fluticasone-Salmeterol) .... One Puff Daily 2)  Ambien 10 Mg Tabs (Zolpidem Tartrate) .... Take 1 Tablet By Mouth Every Night 3)  Premarin 0.625 Mg Tabs (Estrogens Conjugated) .... One By Mouth Daily 4)  Vitamin C 500 Mg Tabs (Ascorbic Acid) .... Take 5)  B Complex 100   Tabs (B Complex Vitamins) .... Once Daily 6)  Amlodipine Besylate 5 Mg  Tabs (Amlodipine Besylate) .... Take 1 Tablet By Mouth Once A Day 7)  Dexilant 60 Mg Cpdr (Dexlansoprazole) .Marland Kitchen.. 1 Cap 30 Min Before Breakfast 8)  Budeprion Sr 150 Mg Tb12 (Bupropion Hcl) .... Take 1 Tablet By Mouth Once A Day 9)  Lyrica 100 Mg Caps (Pregabalin) .... 2 By Mouth At Bedtime As Needed 10)  Boniva 150 Mg Tabs (Ibandronate Sodium) .... Take One Every Month As Directed 11)  Advil 200 Mg Caps (Ibuprofen) .... One By Mouth Three Times A Day 12)  Vitamin D3 1000 Unit Caps (Cholecalciferol) .... One By Mouth Daily 13)  Cod Liver Oil  Caps (Cod Liver Oil) .... One By Mouth Daily 14)  Furosemide 20  Mg Tabs (Furosemide) .... One By Mouth Daily 15)  Ferrous Sulfate 325 (65 Fe) Mg Tabs (Ferrous Sulfate) .... One By Mouth Daily 16)  Clarithromycin 500 Mg Tabs (Clarithromycin) .... One By Mouth Bid 17)  Fluoxetine Hcl 20 Mg  Tabs (Fluoxetine Hcl) .... Take One Tablet By Mouth Every Other Day  Allergies: 1)  ! 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 Take 1 tablet by mouth once a day    Furosemide 20 Mg Tabs (Furosemide) ..... One by mouth daily  BP today: 122/74 Prior BP: 120/72 (02/11/2010)  Labs Reviewed: K+: 4.2 (02/01/2010) Creat: : 1.1 (02/01/2010)   Chol: 200 (12/08/2009)   HDL: 78.50 (12/08/2009)   LDL: 90 (12/08/2009)   TG: 158.0 (12/08/2009)  Problem # 3:  ASTHMA (ICD-493.90) controlled continue current medications  Her updated medication list for this problem includes:    Advair Diskus 250-50 Mcg/dose Aepb (Fluticasone-salmeterol) ..... One puff daily  Pulmonary Functions Reviewed: O2 sat: 97 (02/01/2010)  Problem # 4:  MYALGIA/MYOSITIS  NOS (ICD-729.1) she is doing quite well she uses rare vicodin (maybe 1.5 weekly) Her updated medication list for this problem includes:    Advil 200 Mg Caps (Ibuprofen) ..... One by mouth three times a day  Problem # 5:  ANXIETY (ICD-300.00) doing well I'm not sure she needs the fluoxetine---continue for now.  see me 3 months.  Her updated medication list for this problem includes:    Budeprion Sr 150 Mg Tb12 (Bupropion hcl) .Marland Kitchen... Take 1 tablet by mouth once a day    Fluoxetine Hcl 20 Mg Tabs (Fluoxetine hcl) .Marland Kitchen... Take one tablet by mouth every other day  Complete Medication List: 1)  Advair Diskus 250-50  Mcg/dose Aepb (Fluticasone-salmeterol) .... One puff daily 2)  Ambien 10 Mg Tabs (Zolpidem tartrate) .... Take 1 tablet by mouth every night 3)  Premarin 0.625 Mg Tabs (Estrogens conjugated) .... One by mouth daily 4)  Vitamin C 500 Mg Tabs (Ascorbic acid) .... Take 5)  B Complex 100 Tabs (B complex vitamins) .... Once daily 6)  Amlodipine Besylate 5 Mg Tabs (Amlodipine besylate) .... Take 1 tablet by mouth once a day 7)  Dexilant 60 Mg Cpdr (Dexlansoprazole) .Marland Kitchen.. 1 cap 30 min before breakfast 8)  Budeprion Sr 150 Mg Tb12 (Bupropion hcl) .... Take 1 tablet by mouth once a day 9)  Lyrica 100 Mg Caps (Pregabalin) .... 2 by mouth at bedtime as needed 10)  Boniva 150 Mg Tabs (Ibandronate sodium) .... Take one every month as directed 11)  Advil 200 Mg Caps (Ibuprofen) .... One by mouth three times a day 12)  Vitamin D3 1000 Unit Caps (Cholecalciferol) .... One by mouth daily 13)  Furosemide 20 Mg Tabs (Furosemide) .... One by mouth daily 14)  Ferrous Sulfate 325 (65 Fe) Mg Tabs (Ferrous sulfate) .... One by mouth daily 15)  Clarithromycin 500 Mg Tabs (Clarithromycin) .... One by mouth bid 16)  Fluoxetine Hcl 20 Mg Tabs (Fluoxetine hcl) .... Take one tablet by mouth every other day 17)  Clarithromycin 500 Mg Tabs (Clarithromycin) .Marland Kitchen.. 1  by mouth 2 times daily 18)  Metoclopramide Hcl 10 Mg Tabs (Metoclopramide hcl) .... 1/2-1 by mouth once daily as needed bloating 19)  Hydrocodone-acetaminophen 5-325 Mg Tabs (Hydrocodone-acetaminophen) .... 1/2-1 by mouth once daily as needed for fibromyalgia pain  Other Orders: Tdap => 32yrs IM (16109) Admin 1st Vaccine (60454)  Patient Instructions: 1)  Please schedule a follow-up appointment in 3 months. Prescriptions: HYDROCODONE-ACETAMINOPHEN 5-325 MG TABS (HYDROCODONE-ACETAMINOPHEN) 1/2-1 by mouth once daily as needed for fibromyalgia pain  #20 x 1   Entered and Authorized by:   Birdie Sons MD   Signed by:   Birdie Sons MD on 05/02/2010   Method used:    Print then Give to Patient   RxID:   [id number] METOCLOPRAMIDE HCL 10 MG TABS (METOCLOPRAMIDE HCL) 1/2-1 by mouth once daily as needed bloating  #30 x 1   Entered and Authorized by:   Birdie Sons MD   Signed by:   Birdie Sons MD on 05/02/2010   Method used:   Electronically to        [facility]* (retail)       [address]       [place], [place]  [id number]       Ph: [phone]       Fax: [phone]   RxID:   [id number] FLUOXETINE HCL 20 MG  TABS (FLUOXETINE HCL) Take one tablet by mouth every other day  #90 x  3   Entered and Authorized by:   Birdie Sons MD   Signed by:   Birdie Sons MD on 05/02/2010   Method used:   Historical   RxID:   [id number]    Orders Added: 1)  Tdap => 67yrs IM [14782] 2)  Admin 1st Vaccine [90471] 3)  Est. Patient Level IV [95621]   Immunization History:  Influenza Immunization History:    Influenza:  historical (04/18/2010)  Immunizations Administered:  Tetanus Vaccine:    Vaccine Type: Tdap    Site: left deltoid    Mfr: GlaxoSmithKline    Dose: 0.5 ml    Route: IM    Given by: Sydell Axon LPN    Exp. Date: 04/07/2012    Lot #: [id number]    VIS given: 05/06/08 version given May 02, 2010. Received vaccine at [facility] per patient  Immunization History:  Influenza Immunization History:    Influenza:  Historical (04/18/2010)  Immunizations Administered:  Tetanus Vaccine:    Vaccine Type: Tdap    Site: left deltoid    Mfr: GlaxoSmithKline    Dose: 0.5 ml    Route: IM    Given by: Sydell Axon LPN    Exp. Date: 04/07/2012    Lot #: [id number]    VIS given: 05/06/08 version given May 02, 2010.  Current Allergies (reviewed today): ! LISINOPRIL (LISINOPRIL)

## 2010-07-19 NOTE — Assessment & Plan Note (Signed)
Summary: bilateral leg edema/dm   Vital Signs:  Patient profile:   75 year old female Height:      62 inches (157.48 cm) Weight:      147 pounds (66.82 kg) BMI:     26.98 O2 Sat:      97 % on Room air Temp:     98.0 degrees F (36.67 degrees C) oral Pulse rate:   92 / minute BP sitting:   148 / 88  (left arm) Cuff size:   regular  Vitals Entered By: Josph Macho RMA (February 01, 2010 3:05 PM)  O2 Flow:  Room air CC: Bilateral leg edema X  days/ CF Is Patient Diabetic? No   History of Present Illness: Patient in today c/o b/l lower extremity edema. She describes a year of recurrent surgeries and infection in her right foot. She had her first surgery at [facility] in March of 2010. She had trouble with persistent pain after that surgery and xrays revealed that a bone spur was left at the base of the right great toe so surgery was redone and since then she has had trouble. She had trouble with wound closure and swelling and after multiple evaluations it was discovered that she had an infection with Mycobacterium Chelonae and she was recently started on Clarithromycin for treatment. She had been in a wheelchair much of the last few months and then recently was released to start walking again. Since whe started ambulating about a week ago her feet and legs has begun to swell. Most notable at the end of the day and improved in the am. No CP/palp/SOB/f/c/malasie. She brings in labs taken in June of 2011 at [facility] which were essentially normal except for a mildly elevated potassium at 5.5. She was taking an OTC K supplement which she has subsequently stopped. Did stub her middle toes on the right foot recently resulting in some bruising in those toes, no significant pain  Current Medications (verified): 1)  Advair Diskus 250-50 Mcg/dose Aepb (Fluticasone-Salmeterol) .... One Puff Two Times A Day As Directed 2)  Ambien 10 Mg Tabs (Zolpidem Tartrate) .... Take 1 Tablet By Mouth Every Night 3)   Premarin 0.625 Mg Tabs (Estrogens Conjugated) .... One By Mouth Daily 4)  Vitamin C 500 Mg Tabs (Ascorbic Acid) .... Take 5)  Caltrate 600+d 600-400 Mg-Unit  Tabs (Calcium Carbonate-Vitamin D) .... Two Times A Day 6)  B Complex 100   Tabs (B Complex Vitamins) .... Once Daily 7)  Zomig 5 Mg  Tabs (Zolmitriptan) .Marland Kitchen.. 1 Once Daily As Needed Migraine As Needed 8)  Amlodipine Besylate 5 Mg  Tabs (Amlodipine Besylate) .... Take 1 Tablet By Mouth Once A Day 9)  Dexilant 60 Mg Cpdr (Dexlansoprazole) .Marland Kitchen.. 1 Cap 30 Min Before Breakfast 10)  Multivitamins  Tabs (Multiple Vitamin) .... Once Daily 11)  Budeprion Sr 150 Mg Tb12 (Bupropion Hcl) .... Take 1 Tablet By Mouth Once A Day 12)  Lyrica 150 Mg Caps (Pregabalin) .... Take One Tab Two Times A Day 13)  Flax Seed Oil 1000 Mg Caps (Flaxseed (Linseed)) .... Once Daily 14)  Mirapex 0.25 Mg Tabs (Pramipexole Dihydrochloride) .... As Needed 15)  Boniva 150 Mg Tabs (Ibandronate Sodium) .... Take One Every Month As Directed 16)  Carafate 1 Gm/34ml Susp (Sucralfate) .... Take 2 Teaspoons Before Meals and At Bedtime As Needed May Use Generic 17)  Clarithromycin 500 Mg Tabs (Clarithromycin) .Marland Kitchen.. 1 Tab By Mouth Q 12 Hrs  Allergies (verified): 1)  !  Lisinopril (Lisinopril)  Past History:  Past medical history reviewed for relevance to current acute and chronic problems. Social history (including risk factors) reviewed for relevance to current acute and chronic problems.  Past Medical History: Reviewed history from 06/25/2007 and no changes required. fibromyalgia Asthma Hyperlipidemia Hypertension fibromyalgia Anxiety Depression  Social History: Reviewed history from 02/22/2007 and no changes required. Married Never Smoked Regular exercise-no  Review of Systems      See HPI  Physical Exam  General:  Well-developed,well-nourished,in no acute distress; alert,appropriate and cooperative throughout examination Head:  Normocephalic and atraumatic  without obvious abnormalities. No apparent alopecia or balding. Mouth:  Oral mucosa and oropharynx without lesions or exudates.  Teeth in good repair. Neck:  No deformities, masses, or tenderness noted. Lungs:  Normal respiratory effort, chest expands symmetrically. Lungs are clear to auscultation, no crackles or wheezes. Heart:  Normal rate and regular rhythm. S1 and S2 normal without gallopr, click, rub or other extra sounds.grade 2 /6 systolic murmur.   Abdomen:  Bowel sounds positive,abdomen soft and non-tender without masses, organomegaly or hernias noted. Extremities:  No clubbing, cyanosis, or deformity noted. 1-2 pitting edema b/l Lower extremities. Negative Homan's b/l. Mild ecchymosis noted at base of toes 2-4 on right foot. bandage over base of right great toe C/D/I. Psych:  Cognition and judgment appear intact. Alert and cooperative with normal attention span and concentration. No apparent delusions, illusions, hallucinations   Impression & Recommendations:  Problem # 1:  PEDAL EDEMA (ICD-782.3)  Her updated medication list for this problem includes:    Furosemide 20 Mg Tabs (Furosemide) .Marland Kitchen... 1 tab by mouth once daily x 5d and then as needed edema/ wt gain>3#/24hr. Minimize sodium and place feet up above heart for 15 minutes at least twice daily, report any concerning symptoms  Orders: Prescription Created Electronically [phone])  Problem # 2:  HYPERTENSION (ICD-401.9)  Her updated medication list for this problem includes:    Amlodipine Besylate 5 Mg Tabs (Amlodipine besylate) .Marland Kitchen... Take 1 tablet by mouth once a day    Exforge 5-160 Mg Tabs (Amlodipine besylate-valsartan) .Marland Kitchen... 1 tab by mouth once daily    Furosemide 20 Mg Tabs (Furosemide) .Marland Kitchen... 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  Problem # 4:  HYPERKALEMIA (ICD-276.7)  Orders: TLB-Renal Function Panel (80069-RENAL) Venipuncture (09811) Specimen Handling (91478) Await renal results, sees ID next week and she reports they check a renal and EKG routinely. Will have her seen here the week after that so we may follow her BP and potassium closely  Complete Medication List: 1)  Advair Diskus 250-50 Mcg/dose Aepb (Fluticasone-salmeterol) .... One puff two times a day as directed 2)  Ambien 10 Mg Tabs (Zolpidem tartrate) .... Take 1 tablet by mouth every night 3)  Premarin 0.625 Mg Tabs (Estrogens conjugated) .... One by mouth daily 4)  Vitamin C 500 Mg Tabs (Ascorbic acid) .... Take 5)  Caltrate 600+d 600-400 Mg-unit Tabs (Calcium carbonate-vitamin d) .... Two times a day 6)  B Complex 100 Tabs (B complex vitamins) .... Once daily 7)  Zomig 5 Mg  Tabs (Zolmitriptan) .Marland Kitchen.. 1 once daily as needed migraine as needed 8)  Amlodipine Besylate 5 Mg Tabs (Amlodipine besylate) .... Take 1 tablet by mouth once a day 9)  Dexilant 60 Mg Cpdr (Dexlansoprazole) .Marland Kitchen.. 1 cap 30 min before breakfast 10)  Multivitamins Tabs (Multiple vitamin) .... Once daily 11)  Budeprion Sr 150 Mg Tb12 (Bupropion hcl) .... Take 1 tablet by mouth once a day 12)  Lyrica 150 Mg Caps (Pregabalin) .... Take one tab two times a day 13)  Flax Seed Oil 1000 Mg Caps (Flaxseed (linseed)) .... Once daily 14)  Mirapex 0.25 Mg Tabs (Pramipexole dihydrochloride) .... As needed 15)  Boniva 150 Mg Tabs (Ibandronate sodium) .... Take one every month as directed 16)  Carafate 1 Gm/48ml Susp (Sucralfate) .... Take 2 teaspoons before meals and at bedtime as needed may use generic 17)  Clarithromycin 500 Mg Tabs (Clarithromycin) .Marland Kitchen.. 1  tab by mouth q 12 hrs 18)  Exforge 5-160 Mg Tabs (Amlodipine besylate-valsartan) .Marland Kitchen.. 1 tab by mouth once daily 19)  Furosemide 20 Mg Tabs (Furosemide) .Marland Kitchen.. 1 tab by mouth once daily x 5d and then as needed edema/ wt gain>3#/24hr  Patient Instructions: 1)  Please schedule a follow-up appointment in 2 weeks.  2)  check weights daily 3)  maintain adequate potassium intake 4)  Add Align or other probiotic daily Prescriptions: FUROSEMIDE 20 MG TABS (FUROSEMIDE) 1 tab by mouth once daily x 5d and then as needed edema/ wt gain>3#/24hr  #30 x 1   Entered and Authorized by:   Danise Edge MD   Signed by:   Danise Edge MD on 02/01/2010   Method used:   Electronically to        [facility]* (retail)       [address]       [place], [place]  [id number]       Ph: [phone]       Fax: [phone]   RxID:   [id number] EXFORGE 5-160 MG TABS (AMLODIPINE BESYLATE-VALSARTAN) 1 tab by mouth once daily  #30 x 1   Entered and Authorized by:   Danise Edge MD   Signed by:   Danise Edge MD on 02/01/2010   Method used:   Electronically to        [facility]* (retail)       [address]       [place], [place]  [id number]       Ph: [phone]       Fax: [phone]   RxID:   [id number]   Appended Document: bilateral leg edema/dm call patient. see how she is doing appt next week if not better  Appended Document: bilateral leg edema/dm LMTCB  Appended Document: bilateral leg edema/dm Continues swelling by end of day, but stays down during day with elevation.  Has appt 02/11/10.

## 2010-07-19 NOTE — Assessment & Plan Note (Signed)
Summary: fu per pt/njr   Vital Signs:  Patient profile:   75 year old female Pulse rate:   72 / minute Pulse rhythm:   regular Resp:     12 per minute BP sitting:   114 / 76  (left arm) Cuff size:   regular  Vitals Entered By: Gladis Riffle, RN (December 08, 2009 10:59 AM) CC: FU per pt, wants to update her care--has right foot wrapped and in wheelchair Is Patient Diabetic? No   CC:  FU per pt and wants to update her care--has right foot wrapped and in wheelchair.  History of Present Illness: Struggling with non healing---potentially infecte right foot (spur) she is here with husband she eill get records from [facility]  she denies fever, chills,sweats  has some other aches and pains--she thinks related to fibromyalgia     Preventive Screening-Counseling & Management  Alcohol-Tobacco     Smoking Status: never  Current Medications (verified): 1)  Advair Diskus 250-50 Mcg/dose Aepb (Fluticasone-Salmeterol) .... One Puff Two Times A Day As Directed 2)  Ambien 10 Mg Tabs (Zolpidem Tartrate) .... Take 1 Tablet By Mouth Every Night 3)  Premarin 0.625 Mg Tabs (Estrogens Conjugated) .... One By Mouth Daily 4)  Prozac 20 Mg Caps (Fluoxetine Hcl) .... Take 1 Tablet By Mouth Once A Day 5)  Vitamin C 500 Mg Tabs (Ascorbic Acid) .... Take 6)  Caltrate 600+d 600-400 Mg-Unit  Tabs (Calcium Carbonate-Vitamin D) .... Two Times A Day 7)  B Complex 100   Tabs (B Complex Vitamins) .... Once Daily 8)  Zomig 5 Mg  Tabs (Zolmitriptan) .Marland Kitchen.. 1 Once Daily As Needed Migraine As Needed 9)  Amlodipine Besylate 5 Mg  Tabs (Amlodipine Besylate) .... Take 1 Tablet By Mouth Once A Day 10)  Kapidex 60 Mg Cpdr (Dexlansoprazole) .... Take 1 Tablet By Mouth Once A Day 11)  Diazepam 5 Mg Tabs (Diazepam) .... Take One Talbet Every Day For Restless Leg Syndrome 12)  Multivitamins  Tabs (Multiple Vitamin) .... Once Daily 13)  Budeprion Sr 150 Mg Tb12 (Bupropion Hcl) .... Take 1 Tablet By Mouth Once A Day 14)  Lyrica 150  Mg Caps (Pregabalin) .... Take One Tab Two Times A Day 15)  Flax Seed Oil 1000 Mg Caps (Flaxseed (Linseed)) .... Once Daily 16)  Mirapex 0.25 Mg Tabs (Pramipexole Dihydrochloride) .... As Needed 17)  Boniva 150 Mg Tabs (Ibandronate Sodium) .... Take One Every Month As Directed 18)  Carafate 1 Gm/78ml Susp (Sucralfate) .... Take 2 Teaspoons Before Meals and At Bedtime As Needed May Use Generic  Allergies: 1)  ! Lisinopril (Lisinopril)  Past History:  Past Medical History: Last updated: 06/25/2007 fibromyalgia Asthma Hyperlipidemia Hypertension fibromyalgia Anxiety Depression  Family History: Last updated: 06/25/2007 Family History of Allergy Family History Hypertension-brohter and sister and mother Family History of Stroke M 1st degree relative father age 82 Family History of Cardiovascular disorder-mother CHF  Social History: Last updated: 02/22/2007 Married Never Smoked Regular exercise-no  Risk Factors: Exercise: no (02/22/2007)  Risk Factors: Smoking Status: never (12/08/2009)  Past Surgical History: TAH/BSO L knee surgery Breast implants R toe spur removed Appendectomy Total knee replacement--left Total knee replacement---right 2009 cataract--bilaterally  Physical Exam  General:  Well-developed,well-nourished,in no acute distress; alert,appropriate and cooperative throughout examination Head:  normocephalic and atraumatic.   Eyes:  pupils equal and pupils round.   Ears:  R ear normal and L ear normal.   Neck:  No deformities, masses, or tenderness noted. Chest Wall:  No deformities,  masses, or tenderness noted. Lungs:  Normal respiratory effort, chest expands symmetrically. Lungs are clear to auscultation, no crackles or wheezes. Abdomen:  Bowel sounds positive,abdomen soft and non-tender  Msk:  leg in cast   Impression & Recommendations:  Problem # 1:  HYPERTENSION (ICD-401.9) controlled continue current medications  Her updated medication list  for this problem includes:    Amlodipine Besylate 5 Mg Tabs (Amlodipine besylate) .Marland Kitchen... Take 1 tablet by mouth once a day  BP today: 114/76 Prior BP: 124/84 (04/14/2009)  Labs Reviewed: Chol: 208 (02/22/2007)   HDL: 70.9 (02/22/2007)   LDL: DEL (02/22/2007)   TG: 103 (02/22/2007)  Orders: TLB-BMP (Basic Metabolic Panel-BMET) (80048-METABOL)  Problem # 2:  DEPRESSION (ICD-311) well controlled  even with recent medical setbacks Her updated medication list for this problem includes:    Prozac 20 Mg Caps (Fluoxetine hcl) .Marland Kitchen... Take 1 tablet by mouth once a day    Diazepam 5 Mg Tabs (Diazepam) .Marland Kitchen... Take one talbet every day for restless leg syndrome    Budeprion Sr 150 Mg Tb12 (Bupropion hcl) .Marland Kitchen... Take 1 tablet by mouth once a day  Problem # 3:  ASTHMA (ICD-493.90) no recurrence Her updated medication list for this problem includes:    Advair Diskus 250-50 Mcg/dose Aepb (Fluticasone-salmeterol) ..... One puff two times a day as directed  Problem # 4:  FOOT PAIN (ICD-729.5)  f/u at [facility]  Orders: Venipuncture (52841) T-CRP (C-Reactive Protein) (32440) TLB-CBC Platelet - w/Differential (85025-CBCD) TLB-Sedimentation Rate (ESR) (85652-ESR)  Complete Medication List: 1)  Advair Diskus 250-50 Mcg/dose Aepb (Fluticasone-salmeterol) .... One puff two times a day as directed 2)  Ambien 10 Mg Tabs (Zolpidem tartrate) .... Take 1 tablet by mouth every night 3)  Premarin 0.625 Mg Tabs (Estrogens conjugated) .... One by mouth daily 4)  Prozac 20 Mg Caps (Fluoxetine hcl) .... Take 1 tablet by mouth once a day 5)  Vitamin C 500 Mg Tabs (Ascorbic acid) .... Take 6)  Caltrate 600+d 600-400 Mg-unit Tabs (Calcium carbonate-vitamin d) .... Two times a day 7)  B Complex 100 Tabs (B complex vitamins) .... Once daily 8)  Zomig 5 Mg Tabs (Zolmitriptan) .Marland Kitchen.. 1 once daily as needed migraine as needed 9)  Amlodipine Besylate 5 Mg Tabs (Amlodipine besylate) .... Take 1 tablet by mouth once a day 10)   Kapidex 60 Mg Cpdr (Dexlansoprazole) .... Take 1 tablet by mouth once a day 11)  Diazepam 5 Mg Tabs (Diazepam) .... Take one talbet every day for restless leg syndrome 12)  Multivitamins Tabs (Multiple vitamin) .... Once daily 13)  Budeprion Sr 150 Mg Tb12 (Bupropion hcl) .... Take 1 tablet by mouth once a day 14)  Lyrica 150 Mg Caps (Pregabalin) .... Take one tab two times a day 15)  Flax Seed Oil 1000 Mg Caps (Flaxseed (linseed)) .... Once daily 16)  Mirapex 0.25 Mg Tabs (Pramipexole dihydrochloride) .... As needed 17)  Boniva 150 Mg Tabs (Ibandronate sodium) .... Take one every month as directed 18)  Carafate 1 Gm/54ml Susp (Sucralfate) .... Take 2 teaspoons before meals and at bedtime as needed may use generic  Other Orders: TLB-Lipid Panel (80061-LIPID) TLB-TSH (Thyroid Stimulating Hormone) (84443-TSH) TLB-Hepatic/Liver Function Pnl (80076-HEPATIC)

## 2010-07-21 NOTE — Assessment & Plan Note (Signed)
Summary: bp check, bilateral leg swelling/cdw   Vital Signs:  Patient profile:   75 year old female Weight:      145 pounds Temp:     98.3 degrees F oral Pulse rate:   100 / minute Pulse rhythm:   regular BP sitting:   112 / 72  (left arm) Cuff size:   regular  Vitals Entered By: Alfred Levins, CMA (May 19, 2010 10:17 AM) CC: bilateral leg swelling, bp has been high   CC:  bilateral leg swelling and bp has been high.  History of Present Illness: Concerned with BP--- 3 days ago BP 150s/80s has improved the past 2 days.  She wondered whether related to prozac---self dcd.   ROS: fatigue  Current Medications (verified): 1)  Advair Diskus 250-50 Mcg/dose Aepb (Fluticasone-Salmeterol) .... One Puff Daily 2)  Ambien 10 Mg Tabs (Zolpidem Tartrate) .... Take 1 Tablet By Mouth Every Night 3)  Premarin 0.625 Mg Tabs (Estrogens Conjugated) .... One By Mouth Daily 4)  Vitamin C 500 Mg Tabs (Ascorbic Acid) .... Take 5)  B Complex 100   Tabs (B Complex Vitamins) .... Once Daily 6)  Amlodipine Besylate 5 Mg  Tabs (Amlodipine Besylate) .... Take 1 Tablet By Mouth Once A Day 7)  Dexilant 60 Mg Cpdr (Dexlansoprazole) .Marland Kitchen.. 1 Cap 30 Min Before Breakfast...must Have Office Visit 8)  Budeprion Sr 150 Mg Tb12 (Bupropion Hcl) .... Take 1 Tablet By Mouth Once A Day 9)  Lyrica 100 Mg Caps (Pregabalin) .... 2 By Mouth At Bedtime As Needed 10)  Boniva 150 Mg Tabs (Ibandronate Sodium) .... Take One Every Month As Directed 11)  Advil 200 Mg Caps (Ibuprofen) .... One By Mouth Three Times A Day 12)  Vitamin D3 1000 Unit Caps (Cholecalciferol) .... One By Mouth Daily 13)  Furosemide 20 Mg Tabs (Furosemide) .... One By Mouth Daily 14)  Ferrous Sulfate 325 (65 Fe) Mg Tabs (Ferrous Sulfate) .... One By Mouth Daily 15)  Clarithromycin 500 Mg Tabs (Clarithromycin) .... One By Mouth Bid 16)  Fluoxetine Hcl 20 Mg  Tabs (Fluoxetine Hcl) .... Take One Tablet By Mouth Every Other Day 17)  Clarithromycin 500 Mg   Tabs (Clarithromycin) .Marland Kitchen.. 1  By Mouth 2 Times Daily 18)  Metoclopramide Hcl 10 Mg Tabs (Metoclopramide Hcl) .... 1/2-1 By Mouth Once Daily As Needed Bloating 19)  Hydrocodone-Acetaminophen 5-325 Mg Tabs (Hydrocodone-Acetaminophen) .... 1/2-1 By Mouth Once Daily As Needed For Fibromyalgia Pain  Allergies (verified): 1)  ! Lisinopril (Lisinopril)  Physical Exam  General:  alert and well-developed.   Head:  normocephalic and atraumatic.   Neck:  No deformities, masses, or tenderness noted. Chest Wall:  No deformities, masses, or tenderness noted. Lungs:  normal respiratory effort and no intercostal retractions.   Heart:  normal rate and regular rhythm.     Impression & Recommendations:  Problem # 1:  HYPERTENSION (ICD-401.9) controlled amlodipine might contribute to LE edema.  I think she should stay on the same meds for the time being.  Her updated medication list for this problem includes:    Amlodipine Besylate 5 Mg Tabs (Amlodipine besylate) .Marland Kitchen... Take 1 tablet by mouth once a day    Furosemide 20 Mg Tabs (Furosemide) ..... One by mouth daily as needed for lower extremeity edema  BP today: 112/72 Prior BP: 122/74 (05/02/2010)  Labs Reviewed: K+: 4.2 (02/01/2010) Creat: : 1.1 (02/01/2010)   Chol: 200 (12/08/2009)   HDL: 78.50 (12/08/2009)   LDL: 90 (12/08/2009)   TG:  158.0 (12/08/2009)  Complete Medication List: 1)  Advair Diskus 250-50 Mcg/dose Aepb (Fluticasone-salmeterol) .... One puff daily 2)  Ambien 10 Mg Tabs (Zolpidem tartrate) .... Take 1 tablet by mouth every night 3)  Premarin 0.625 Mg Tabs (Estrogens conjugated) .... One by mouth daily 4)  Vitamin C 500 Mg Tabs (Ascorbic acid) .... Take 5)  B Complex 100 Tabs (B complex vitamins) .... Once daily 6)  Amlodipine Besylate 5 Mg Tabs (Amlodipine besylate) .... Take 1 tablet by mouth once a day 7)  Dexilant 60 Mg Cpdr (Dexlansoprazole) .Marland Kitchen.. 1 cap 30 min before breakfast...must have office visit 8)  Budeprion Sr 150 Mg  Tb12 (Bupropion hcl) .... Take 1 tablet by mouth once a day 9)  Lyrica 100 Mg Caps (Pregabalin) .... 2 by mouth at bedtime as needed 10)  Boniva 150 Mg Tabs (Ibandronate sodium) .... Take one every month as directed 11)  Advil 200 Mg Caps (Ibuprofen) .... One by mouth three times a day 12)  Vitamin D3 1000 Unit Caps (Cholecalciferol) .... One by mouth daily 13)  Furosemide 20 Mg Tabs (Furosemide) .... One by mouth daily as needed for lower extremeity edema 14)  Ferrous Sulfate 325 (65 Fe) Mg Tabs (Ferrous sulfate) .... One by mouth daily 15)  Clarithromycin 500 Mg Tabs (Clarithromycin) .... One by mouth bid 16)  Fluoxetine Hcl 20 Mg Tabs (Fluoxetine hcl) .... Take one tablet by mouth every other day 17)  Clarithromycin 500 Mg Tabs (Clarithromycin) .Marland Kitchen.. 1  by mouth 2 times daily 18)  Metoclopramide Hcl 10 Mg Tabs (Metoclopramide hcl) .... 1/2-1 by mouth once daily as needed bloating 19)  Hydrocodone-acetaminophen 5-325 Mg Tabs (Hydrocodone-acetaminophen) .... 1/2-1 by mouth once daily as needed for fibromyalgia pain  Other Orders: Zoster (Shingles) Vaccine Live [phone]) Admin 1st Vaccine (78295) Prescriptions: FUROSEMIDE 20 MG TABS (FUROSEMIDE) one by mouth daily as needed for lower extremeity edema  #30 x 1   Entered and Authorized by:   Birdie Sons MD   Signed by:   Birdie Sons MD on 05/19/2010   Method used:   Electronically to        [facility]* (retail)       [address]       [place], [place]  [address]       Ph: [phone]       Fax: [phone]   RxID:   [id number]    Orders Added: 1)  Est. Patient Level III [56387] 2)  Zoster (Shingles) Vaccine Live [90736] 3)  Admin 1st Vaccine [56433]   Immunizations Administered:  Zostavax # 1:    Vaccine Type: Zostavax    Site: left arm    Mfr: Merck    Dose: 0.108mL    Route: [place]    Given by: Alfred Levins, CMA    Exp. Date: 02/04/2011    Lot #: [id number]   Immunizations Administered:  Zostavax #  1:    Vaccine Type: Zostavax    Site: left arm    Mfr: Merck    Dose: 0.16mL    Route: [place]    Given by: Alfred Levins, CMA    Exp. Date: 02/04/2011    Lot #: [id number]

## 2010-07-21 NOTE — Progress Notes (Signed)
Summary: diazepam  Phone Note From Pharmacy   Caller: [facility]* Call For: swords  Summary of Call: refill diazepam 5mg  1 by mouth once daily for restless leg syndrome.  Removed from med list on 01/2010 with comment that "pt states she no longer takes" Initial call taken by: Alfred Levins, CMA,  June 10, 2010 2:25 PM  Follow-up for Phone Call        ok to refill #30/ 1 refill Follow-up by: Birdie Sons MD,  June 10, 2010 2:51 PM  Additional Follow-up for Phone Call Additional follow up Details #1::        Phone call completed, Pharmacist called Additional Follow-up by: Alfred Levins, CMA,  June 10, 2010 2:53 PM    New/Updated Medications: DIAZEPAM 5 MG TABS (DIAZEPAM) 1 by mouth once daily for restless leg Prescriptions: DIAZEPAM 5 MG TABS (DIAZEPAM) 1 by mouth once daily for restless leg  #30 x 1   Entered by:   Alfred Levins, CMA   Authorized by:   Birdie Sons MD   Signed by:   Alfred Levins, CMA on 06/10/2010   Method used:   Telephoned to ...       [facility]* (retail)       [address]       [place], [place]  [address]       Ph: [phone]       Fax: [phone]   RxID:   [id number]

## 2010-07-21 NOTE — Assessment & Plan Note (Signed)
Summary: med refill--ch.    History of Present Illness Visit Type: Follow-up Visit Primary GI MD: Sheryn Bison MD FACP FAGA Primary Provider: Birdie Sons, MD  Requesting Provider: na Chief Complaint: Yearly f/u for GERD. Pt states that she is doing good and denies any GI complaints. Pt needs refill on Carfate, Dexilant, and Metoclopramide History of Present Illness:   75 year old Caucasian female with chronic GERD previously manifested by right precordial chest pain. She currently is asymptomatic on Dexilant 60 mg a day with p.r.n. Reglan and Carafate use for acid reflux and IBS. She recently completed a month's treatment with clarithromycin 500 mg twice a day for orthopedic problems. She temporarily had some diarrhea which seemed to have resolved. There is no history of anorexic, weight loss, dysphagia, melena, hematochezia, or hepatobiliary complaints. Previous colonoscopy showed mild diverticulosis but no colon polyps.  Her hypertension is well controlled by Dr. Cato Mulligan and she is on multiple different medications listed and reviewed in her record today.   GI Review of Systems      Denies abdominal pain, acid reflux, belching, bloating, chest pain, dysphagia with liquids, dysphagia with solids, heartburn, loss of appetite, nausea, vomiting, vomiting blood, weight loss, and  weight gain.        Denies anal fissure, black tarry stools, change in bowel habit, constipation, diarrhea, diverticulosis, fecal incontinence, heme positive stool, hemorrhoids, irritable bowel syndrome, jaundice, light color stool, liver problems, rectal bleeding, and  rectal pain.    Current Medications (verified): 1)  Advair Diskus 250-50 Mcg/dose Aepb (Fluticasone-Salmeterol) .... One Puff Daily 2)  Ambien 10 Mg Tabs (Zolpidem Tartrate) .... Take 1 Tablet By Mouth Every Night 3)  Premarin 0.625 Mg Tabs (Estrogens Conjugated) .... One By Mouth Daily 4)  Vitamin C 500 Mg Tabs (Ascorbic Acid) .... Take 5)  B  Complex 100   Tabs (B Complex Vitamins) .... Once Daily 6)  Amlodipine Besylate 5 Mg  Tabs (Amlodipine Besylate) .... Take 1 Tablet By Mouth Once A Day 7)  Dexilant 60 Mg Cpdr (Dexlansoprazole) .Marland Kitchen.. 1 Cap 30 Min Before Breakfast...must Keep Office Visit No More Refills 8)  Budeprion Sr 150 Mg Tb12 (Bupropion Hcl) .... Take 1 Tablet By Mouth Once A Day 9)  Lyrica 100 Mg Caps (Pregabalin) .... 2 By Mouth At Bedtime As Needed 10)  Boniva 150 Mg Tabs (Ibandronate Sodium) .... Take One Every Month As Directed 11)  Advil 200 Mg Caps (Ibuprofen) .... One By Mouth Three Times A Day 12)  Vitamin D3 1000 Unit Caps (Cholecalciferol) .... One By Mouth Daily 13)  Ferrous Sulfate 325 (65 Fe) Mg Tabs (Ferrous Sulfate) .... One By Mouth Daily 14)  Fluoxetine Hcl 20 Mg  Tabs (Fluoxetine Hcl) .... Take One Tablet By Mouth Every Other Day 15)  Metoclopramide Hcl 10 Mg Tabs (Metoclopramide Hcl) .... 1/2-1 By Mouth Once Daily As Needed Bloating 16)  Hydrocodone-Acetaminophen 5-325 Mg Tabs (Hydrocodone-Acetaminophen) .... 1/2-1 By Mouth Once Daily As Needed For Fibromyalgia Pain 17)  Diazepam 5 Mg Tabs (Diazepam) .Marland Kitchen.. 1 By Mouth Once Daily For Restless Leg 18)  Hydrochlorothiazide 25 Mg Tabs (Hydrochlorothiazide) .... One By Mouth Q Day 19)  Carafate 1 Gm/54ml Susp (Sucralfate) .... As Needed  Allergies (verified): 1)  ! Lisinopril (Lisinopril)  Past History:  Past medical, surgical, family and social histories (including risk factors) reviewed for relevance to current acute and chronic problems.  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 TAH/BSO L knee surgery Breast implants R toe  spur removed Appendectomy Total knee replacement--left Total knee replacement---right 2009 cataract--bilaterally  Family History: Reviewed history from 06/30/2010 and no changes required. Family History of Allergy Family History Hypertension-brohter and sister and mother Family History of Stroke M 1st degree relative father age 73 Family History of Cardiovascular disorder-mother CHF Family History of Colon Cancer:  Social History: Reviewed history from 02/22/2007 and no changes required. Retired Married Never Smoked Regular exercise-no  Vital Signs:  Patient profile:   75 year old female Height:      62 inches Weight:      143 pounds BMI:     26.25 BSA:     1.66 Pulse rate:   96 / minute Pulse rhythm:   regular BP sitting:   128 / 76  (left arm) Cuff size:   regular  Vitals Entered By: Ok Anis CMA (July 01, 2010 11:35 AM)  Physical Exam  General:  Well-developed,well-nourished,in no acute distress; alert,appropriate and cooperative throughout examination Head:  Normocephalic and atraumatic without obvious abnormalities. No apparent alopecia or balding. Eyes:  No corneal or conjunctival inflammation noted. EOMI. Perrla. Funduscopic exam benign, without hemorrhages, exudates or papilledema. Vision grossly normal.exam deferred to patient's ophthalmologist.   Abdomen:  Bowel sounds positive,abdomen soft and non-tender without masses, organomegaly or hernias noted. Neurologic:  No cranial nerve deficits noted. Station and gait are normal. Plantar reflexes are down-going bilaterally. DTRs are symmetrical throughout. Sensory, motor and coordinative functions appear intact.   Impression & Recommendations:  Problem # 1:  GERD (ICD-530.81) Assessment Improved continue Dexilant 60 mg a day with p.r.n. Carafate and Reglan use. Standard antireflux maneuvers reviewed.  Problem # 2:  COLONIC POLYPS (ICD-211.3) Assessment: Unchanged colonoscopy followup as per clinical  protocol.  Problem # 3:  HYPERTENSION (ICD-401.9) Assessment: Improved blood pressure today normal at 128/78. She is to continue all her other medications as per primary care.  Problem # 4:  IRRITABLE BOWEL SYNDROME (ICD-564.1) Assessment: Unchanged Trial of probiotics with Align daily for 2 weeks as a clinical test. She has no symptoms currently suspicious for C. difficile infection.  Patient Instructions: 1)  Copy sent to : Birdie Sons, MD  2)  Your prescription(s) have been sent to you pharmacy.  3)  The medication list was reviewed and reconciled.  All changed / newly prescribed medications were explained.  A complete medication list was provided to the patient / caregiver. 4)  Please continue current medications.  5)  Avoid foods high in acid content ( tomatoes, citrus juices, spicy foods) . Avoid eating within 3 to 4 hours of lying down or before exercising. Do not over eat; try smaller more frequent meals. Elevate head of bed four inches when sleeping.  6)  High Fiber, Low Fat  Healthy Eating Plan brochure given.  Prescriptions: METOCLOPRAMIDE HCL 10 MG TABS (METOCLOPRAMIDE HCL) 1/2-1 by mouth once daily as needed bloating  #30 x 1   Entered by:   Harlow Mares CMA (AAMA)   Authorized by:   Mardella Layman MD [facility]   Signed by:   Harlow Mares CMA (AAMA) on 07/01/2010   Method used:   Electronically to        [facility]* (retail)       [address]       [place], [place]  [address]  Ph: [phone]       Fax: [phone]   RxID:   [id number] DEXILANT 60 MG CPDR (DEXLANSOPRAZOLE) 1 cap 30 min before breakfast  #30 x 12   Entered by:   Harlow Mares CMA (AAMA)   Authorized by:   Mardella Layman MD [facility]   Signed by:   Harlow Mares CMA (AAMA) on 07/01/2010   Method used:   Electronically to        [facility]* (retail)       [address]       [place], [place]  [address]       Ph: [phone]       Fax: [phone]   RxID:    [id number] CARAFATE 1 GM/10ML SUSP (SUCRALFATE) as needed  #1260ml x 6   Entered by:   Harlow Mares CMA (AAMA)   Authorized by:   Mardella Layman MD [facility]   Signed by:   Harlow Mares CMA (AAMA) on 07/01/2010   Method used:   Electronically to        [facility]* (retail)       [address]       [place], [place]  [address]       Ph: [phone]       Fax: [phone]   RxID:   [id number]

## 2010-07-21 NOTE — Progress Notes (Signed)
Summary: Medication   Phone Note Call from Patient Call back at Home Phone [phone]   Caller: Patient Call For: Dr. Jarold Motto Reason for Call: Refill Medication Summary of Call: Needs refill on her Dexilant.Marland KitchenMarland KitchenMarland KitchenComputer Sciences Corporation...Marland KitchenMarland KitchenMarland Kitchen Appt. sch'd 07-01-10 Initial call taken by: Karna Christmas,  June 15, 2010 10:49 AM  Follow-up for Phone Call        rx sent. Follow-up by: Harlow Mares CMA Duncan Dull),  June 15, 2010 10:53 AM    New/Updated Medications: DEXILANT 60 MG CPDR (DEXLANSOPRAZOLE) 1 cap 30 min before breakfast...MUST KEEP OFFICE VISIT NO MORE REFILLS Prescriptions: DEXILANT 60 MG CPDR (DEXLANSOPRAZOLE) 1 cap 30 min before breakfast...MUST KEEP OFFICE VISIT NO MORE REFILLS  #30 x 0   Entered by:   Harlow Mares CMA (AAMA)   Authorized by:   Mardella Layman MD [facility]   Signed by:   Harlow Mares CMA (AAMA) on 06/15/2010   Method used:   Electronically to        [facility]* (retail)       [address]       [place], [place]  [address]       Ph: [phone]       Fax: [phone]   RxID:   [id number]

## 2010-07-21 NOTE — Progress Notes (Signed)
Summary: Call A Nurse  Phone Note Outgoing Call   Summary of Call: call patient. see how she is doing  Follow-up for Phone Call        pt feels fine.  Her BP was 148/78 when she went to bed last night.  Pt checked her bp while she was on the phone with me and it was 151/81 pulse 88 but her pulse has been in 90s Follow-up by: Alfred Levins, CMA,  June 01, 2010 8:25 AM  Additional Follow-up for Phone Call Additional follow up Details #1::        per Dr Cato Mulligan monitor bp the rest of the week and through weekend and call Monday with readings.  pt was also instructed to call back with any questions or concerns Additional Follow-up by: Alfred Levins, CMA,  June 01, 2010 8:27 AM     [facility] Triage Call Report Triage Record Num: [id number] Operator: Albertine Grates Patient Name: Christina Hoover Call Date & Time: 05/30/2010 6:32:01PM Patient Phone: [phone] PCP: Valetta Mole. [facility] Patient Gender: Female PCP Fax : [phone] Patient DOB: 06-13-1936 Practice Name: [facility] Reason for Call: BP has been 170/78 12-12. Has fear of having stroke. Denies emergent symptoms. Is out to dinner at time of call of call. Took BP med 1 hour prior to call. Home care advice given. Protocol(s) Used: Hypertension, Diagnosed or Suspected Recommended Outcome per Protocol: Provide Home/Self Care Reason for Outcome: Single elevated blood pressure reading and has questions Care Advice:  ~ 12/

## 2010-07-21 NOTE — Letter (Signed)
Summary: [facility]   Imported By: Maryln Gottron 07/05/2010 15:41:32  _____________________________________________________________________  External Attachment:    Type:   Image     Comment:   External Document

## 2010-07-21 NOTE — Progress Notes (Signed)
  Phone Note Call from Patient   Caller: Patient Call For: Birdie Sons MD Summary of Call: Pt is monitoring BP x 1 month with the highest 172/?         140's/70's every other time but she is feeling dizzy at times. [phone] Initial call taken by: Lynann Beaver CMA AAMA,  June 28, 2010 3:12 PM  Follow-up for Phone Call        add hctz 25 mg by mouth qd Follow-up by: Birdie Sons MD,  June 29, 2010 1:00 PM    New/Updated Medications: HYDROCHLOROTHIAZIDE 25 MG TABS (HYDROCHLOROTHIAZIDE) one by mouth q day Prescriptions: HYDROCHLOROTHIAZIDE 25 MG TABS (HYDROCHLOROTHIAZIDE) one by mouth q day  #30 x 5   Entered by:   Lynann Beaver CMA AAMA   Authorized by:   Birdie Sons MD   Signed by:   Lynann Beaver CMA AAMA on 06/29/2010   Method used:   Electronically to        [facility]* (retail)       [address]       [place], [place]  [address]       Ph: [phone]       Fax: [phone]   RxID:   [id number]  Notifed pt.

## 2010-08-01 ENCOUNTER — Encounter: Payer: Self-pay | Admitting: Internal Medicine

## 2010-08-05 ENCOUNTER — Telehealth: Payer: Self-pay | Admitting: Internal Medicine

## 2010-08-05 NOTE — Telephone Encounter (Signed)
Triage vm---has chest congestion and stuffiness. Bp is ok no fever. She knows that it is bronchitis and would like something called to [place]----[phone]. Pt refused several times to come and see a physician. Please advise.

## 2010-08-09 ENCOUNTER — Other Ambulatory Visit: Payer: Self-pay | Admitting: Internal Medicine

## 2010-08-09 NOTE — Telephone Encounter (Signed)
Per Dr Lovell Sheehan, take mucinex fast max liquid in a dark blue bottle.  Pt was notified on 08/05/10

## 2010-08-10 ENCOUNTER — Other Ambulatory Visit: Payer: Self-pay | Admitting: Internal Medicine

## 2010-08-10 NOTE — Progress Notes (Signed)
No show

## 2010-09-09 NOTE — Progress Notes (Signed)
This encounter was created in error - please disregard.

## 2010-09-21 ENCOUNTER — Telehealth: Payer: Self-pay | Admitting: *Deleted

## 2010-09-21 NOTE — Telephone Encounter (Signed)
Scheduled appt with Dr.Burchette.

## 2010-09-21 NOTE — Telephone Encounter (Signed)
Husband is calling stating pt is very congested in chest and coughing.  Left message for pt to call back for appt.

## 2010-09-22 ENCOUNTER — Encounter: Payer: Self-pay | Admitting: Family Medicine

## 2010-09-22 ENCOUNTER — Ambulatory Visit (INDEPENDENT_AMBULATORY_CARE_PROVIDER_SITE_OTHER): Payer: Medicare Other | Admitting: Family Medicine

## 2010-09-22 VITALS — BP 130/78 | Temp 98.2°F | Ht 62.0 in | Wt 145.0 lb

## 2010-09-22 DIAGNOSIS — J040 Acute laryngitis: Secondary | ICD-10-CM

## 2010-09-22 DIAGNOSIS — J209 Acute bronchitis, unspecified: Secondary | ICD-10-CM

## 2010-09-22 MED ORDER — AZITHROMYCIN 250 MG PO TABS: 250.0000 mg | ORAL_TABLET | Freq: Every day | ORAL | Status: AC

## 2010-09-22 NOTE — Progress Notes (Signed)
  Subjective:    Patient ID: Christina Hoover, female    DOB: 01-May-1936, 75 y.o.   MRN: [id number]  HPI Acute illness. Main symptom is laryngitis past 2-3 days. Minimal nasal symptoms. History of some seasonal allergies. No fever. Denies sore throat. Cough productive yellow sputum past couple days.  She is a nonsmoker. No dyspnea. She has history of asthma and takes Advair regularly. No oropharyngeal rash   Review of Systems  Constitutional: Negative for fever, chills and activity change.  HENT: Positive for voice change. Negative for congestion, sore throat, rhinorrhea and trouble swallowing.   Respiratory: Positive for cough. Negative for shortness of breath and wheezing.   Cardiovascular: Negative for chest pain.       Objective:   Physical Exam  Constitutional: She appears well-developed and well-nourished.  HENT:  Right Ear: External ear normal.  Left Ear: External ear normal.  Mouth/Throat: Oropharynx is clear and moist. No oropharyngeal exudate.  Neck: Neck supple. No thyromegaly present.  Cardiovascular: Normal rate, regular rhythm and normal heart sounds.   Pulmonary/Chest: Effort normal and breath sounds normal. She has no wheezes. She has no rales.  Lymphadenopathy:    She has no cervical adenopathy.          Assessment & Plan:  Acute laryngitis. We explained this is likely viral. Try over-the-counter Mucinex and plenty of fluids. No anabolic this time but consider Zithromax if she has any fever or worsening symptoms

## 2010-09-22 NOTE — Patient Instructions (Signed)
Laryngitis   Laryngitis is an inflammation and swelling of the voice box and the area around it. It may cause your voice to change. You may lose your voice entirely for a short while. The voice box (larynx) is located at the top of the airway to the lungs (windpipe, trachea). It contains the vocal cords. When the vocal cords become inflamed or infected, they swell. This is usually linked with hoarseness. It is also linked with loss of voice. If severe, this condition can block the airway. The problem is most common in late fall, winter, or early spring. With or without treatment, you should be well in 7 to 14 days.   The most common form of laryngitis is usually caused by a virus. It may also be part of a bacterial (germ) infection, or part of a common cold, bronchitis, flu, or pneumonia. People who smoke, have allergies, or strain their voices by yelling, talking, or singing may also develop laryngitis.   Laryngitis often follows or occurs during a viral upper respiratory infection. It usually gets better without treatment. Laryngitis is usually not associated with breathing difficulties. There are forms of laryngitis that happen in children. These forms can lead to serious or fatal respiratory blockage. Croup and epiglotitis (rare) are two of these forms.   Other causes of laryngitis include: laryngeal polyps; laryngeal paralysis (such as Horner Syndrome); malignant (cancerous) tumors (lumps) and changes that lead up to the tumor, allergies, and injury.   SOME COMMON SYMPTOMS OF LARYNGITIS INCLUDE:   Ø Recent or current upper respiratory infection.   Ø Hoarse, low voice and a scratchy, possibly sore throat. You also might lose your voice. You may come down with a fever. You may feel like you have a lump in your throat. You may feel very tired.   Ø Dry cough or the barking cough of croup.   Ø Fever.   Ø Swollen lymph nodes or glands in the neck.   Ø Drooping eyelid on one side (Horner Syndrome).   DIAGNOSIS   A  physical examination is usually all that is necessary for the patient with hoarseness associated with a respiratory tract infection. A patient, particularly a smoker, who has continuing hoarseness, will need to see another caregiver for tests of the throat and upper airway.   PREVENTION   Ø Avoidance of upper respiratory infections during cold and flu season may help. Using good hygienic practices such as hand washing, and avoiding people with respiratory illnesses and crowded close quarters, may also help.   Ø Quitting smoking can help prevent laryngitis, malignancies of the head and neck, and of the lungs.   Ø Do not smoke around or in the home of a child with respiratory problems.   HOME CARE INSTRUCTIONS   Ø Do not use your voice for several days. Either speak very softly or write notes until you can talk normally. Discourage your child from talking out loud.   Ø Give or take plenty of warm drinks to soothe the throat.   Ø Use a cool-mist humidifier (vaporizer) to increase air moisture. This will help relieve the tight feeling in your throat. Hot, steamy showers can also help. Keep the air humid in a child's room.   Ø Do not drink alcohol or smoke until your voice is back to normal. Quit smoking.   Ø Get plenty of rest.   Ø Drink extra fluids, such as water, fruit juice, and tea.   Ø Only take over-the-counter or prescription   medicines for pain, discomfort, or fever as directed by your caregiver.   Ø Your caregiver may prescribe an antibiotic (medications that kill germs) to treat infection caused by bacteria (germs). This will not be done if the infection is viral. Since most common laryngitis is viral, treatment with antibiotics is generally not helpful. Antibiotics do not affect viruses. Their improper use may cause more harm than good.   SEEK IMMEDIATE MEDICAL CARE IF:   Ø There is difficulty breathing, swallowing, or if drooling is present in a small child.   Ø If hoarseness has lasted for more than 1 week  in a child, or 2 weeks in an adult.   Ø You develop a temperature greater than 101. A fever above 100.4° F (38° C) may indicate the presence of a bacterial infection, such as bronchitis, tonsillitis or sinusitis, which may require an antibiotic.   Ø There is hoarseness lasting longer than 7 days.   Ø There is bleeding from the throat.   Ø The throat is getting worse rather than better.   Ø There are large, tender lumps (“swollen glands”) in the neck.   Ø The barking cough of croup develops.   COMPLICATIONS   Ø Laryngitis is rarely serious. It hardly ever lasts more than 7 days.   Ø It can be part of a more serious infection such as tonsillitis or bronchitis.   Ø In young children, a swollen larynx can obstruct the passage of air. This causes breathing difficulties and croup. This is a more serious complication.   Ø If a viral infection is followed by a bacterial (germ) infection causing tonsillitis or bronchitis, your caregiver may prescribe antibiotics.   Ø If laryngitis develops into croup, seek medical treatment immediately.   MAKE SURE YOU:   Ø Understand these instructions.   Ø Will watch your condition.   Ø Will get help right away if you are not doing well or get worse.   Document Released: 06/05/2005 Document Re-Released: 09/01/2008   Exit[employer]® Patient Information ©2011 [employer].

## 2010-10-03 ENCOUNTER — Other Ambulatory Visit: Payer: Self-pay | Admitting: *Deleted

## 2010-10-03 ENCOUNTER — Telehealth: Payer: Self-pay | Admitting: *Deleted

## 2010-10-03 DIAGNOSIS — F329 Major depressive disorder, single episode, unspecified: Secondary | ICD-10-CM

## 2010-10-03 DIAGNOSIS — G4700 Insomnia, unspecified: Secondary | ICD-10-CM

## 2010-10-03 MED ORDER — ZOLPIDEM TARTRATE 10 MG PO TABS: 10.0000 mg | ORAL_TABLET | Freq: Every evening | ORAL | Status: DC | PRN

## 2010-10-03 MED ORDER — BUPROPION HCL ER (SR) 150 MG PO TB12: 150.0000 mg | ORAL_TABLET | Freq: Two times a day (BID) | ORAL | Status: DC

## 2010-10-03 NOTE — Telephone Encounter (Signed)
rx sent in 

## 2010-10-03 NOTE — Telephone Encounter (Signed)
Needs Buproprion 150 mg. q hs Zolpidem 10 mg. One po q hs Mirapex .25 mg. One po q hs Lyrica 150 mg. Daily [facility]

## 2010-10-04 ENCOUNTER — Telehealth: Payer: Self-pay | Admitting: *Deleted

## 2010-10-04 NOTE — Telephone Encounter (Signed)
Pt. Wants to see Dr. Cato Mulligan Thursday and no other MD for her arthritis.

## 2010-10-05 ENCOUNTER — Telehealth: Payer: Self-pay | Admitting: *Deleted

## 2010-10-05 NOTE — Telephone Encounter (Signed)
lmoam to return call °

## 2010-10-05 NOTE — Telephone Encounter (Signed)
Will you see what time you can schedule this, [name]?  Thanks.

## 2010-10-05 NOTE — Telephone Encounter (Signed)
Patient will come in on 10-07-10 as indicated to see Dr Cato Mulligan.

## 2010-10-05 NOTE — Telephone Encounter (Signed)
ok 

## 2010-10-05 NOTE — Telephone Encounter (Signed)
Returning call for appt 

## 2010-10-07 ENCOUNTER — Ambulatory Visit (INDEPENDENT_AMBULATORY_CARE_PROVIDER_SITE_OTHER): Payer: Medicare Other | Admitting: Internal Medicine

## 2010-10-07 ENCOUNTER — Encounter: Payer: Self-pay | Admitting: Internal Medicine

## 2010-10-07 DIAGNOSIS — IMO0001 Reserved for inherently not codable concepts without codable children: Secondary | ICD-10-CM

## 2010-10-07 DIAGNOSIS — I10 Essential (primary) hypertension: Secondary | ICD-10-CM

## 2010-10-07 LAB — CBC WITH DIFFERENTIAL/PLATELET
Eosinophils Relative: 6.5 % — ABNORMAL HIGH (ref 0.0–5.0)
HCT: 43.1 % (ref 36.0–46.0)
Hemoglobin: 14.8 g/dL (ref 12.0–15.0)
Lymphs Abs: 1.8 10*3/uL (ref 0.7–4.0)
MCV: 92.4 fl (ref 78.0–100.0)
Monocytes Absolute: 0.5 10*3/uL (ref 0.1–1.0)
Neutro Abs: 4.2 10*3/uL (ref 1.4–7.7)
Platelets: 267 10*3/uL (ref 150.0–400.0)
WBC: 6.9 10*3/uL (ref 4.5–10.5)

## 2010-10-07 LAB — BASIC METABOLIC PANEL
BUN: 12 mg/dL (ref 6–23)
CO2: 27 mEq/L (ref 19–32)
Chloride: 107 mEq/L (ref 96–112)
Creatinine, Ser: 0.7 mg/dL (ref 0.4–1.2)

## 2010-10-07 NOTE — Assessment & Plan Note (Addendum)
Multiple aches and pains without unifying diagnosis. She has known DDD of back.   She has many peripheral joints that hurt without known cause. She tells me that she has been told that she has arthritis in multiple joints. She has had eval at [facility]---a lot of lab work.  She has had PT at [facility] ortho---no relief.  I will check laboratory work today. I will followup with her next week.

## 2010-10-10 NOTE — Progress Notes (Signed)
  Subjective:    Patient ID: Christina Hoover, female    DOB: 1936-04-26, 75 y.o.   MRN: [id number]  HPI Patient comes in for evaluation of multiple aches and pains. She has chronic myalgia and myositis symptoms. She has been told in the past she has fibromyalgia. She continues to have back pain, shoulder pain, hand pain. All of these have been extensively evaluated in the past. She has seen rheumatology at [facility]. She denies any acute joint inflammation but does feel stiff most mornings. Patient has a history of depression but has been feeling reasonably well lately.  Past Medical History  Diagnosis Date  . GERD (gastroesophageal reflux disease)   . History of colonic polyps   . Atypical mycobacterial infection   . Hypertension   . Myalgia and myositis, unspecified   . Hyperlipidemia   . Headache   . Depression   . Asthma   . Anxiety    Past Surgical History  Procedure Date  . Total abdominal hysterectomy w/ bilateral salpingoophorectomy   . Knee surgery     Left  . Breast surgery   . Toe spur     right toe spur removed  . Appendectomy   . Replacement total knee     Right 2009/Left   . Cataract extraction, bilateral     reports that she has never smoked. She does not have any smokeless tobacco history on file. Her alcohol and drug histories not on file. family history includes Heart failure in her mother; Hypertension in her brother, mother, and sister; and Stroke (age of onset:72) in her father. Allergies  Allergen Reactions  . Lisinopril     REACTION: hacky cough      Review of Systems  patient denies chest pain, shortness of breath, orthopnea. Denies lower extremity edema, abdominal pain, change in appetite, change in bowel movements. Patient denies rashes, musculoskeletal complaints. No other specific complaints in a complete review of systems.      Objective:   Physical Exam Very pleasant female in no acute distress. HEENT exam atraumatic,  normocephalic symmetric her muscles are intact. Neck is supple without lymphadenopathy or thyromegaly. Chest clear to auscultation cardiac exam S1-S2 are regular. Abdominal exam active bowel sounds, soft, nontender. Extremities no edema. No joint swelling is noted. She has full range of motion of her shoulders, wrists, elbows.       Assessment & Plan:

## 2010-11-01 NOTE — Assessment & Plan Note (Signed)
[facility]                                 ON-CALL NOTE   NAME:Christina Hoover, Christina Hoover                       MRN:          [id number]  DATE:02/29/2008                            DOB:          09-02-1935    The patient's husband Glinda Natzke calls from [phone].  The patient  is a patient of Dr. Cato Mulligan.  He called up saying he thought she had the  symptoms for the flu but she has no fever.  She has not been taking any  over-the-counter medicine, has diarrhea, and feels very weak and had a  knee replacement 3 weeks ago.  I recommended he take her to Urgent Care  Emergency Room to be reevaluated.  He is going to take her to [facility] to be reevaluated.     Lelon Perla, DO  Electronically Signed    Shawnie Dapper  DD: 02/29/2008  DT: 03/01/2008  Job #: [id number]   cc:   Valetta Mole. Swords, MD

## 2010-11-01 NOTE — Assessment & Plan Note (Signed)
[facility]                         GASTROENTEROLOGY OFFICE NOTE   NAME:Blodgett, JUDY                         MRN:          [id number]  DATE:09/24/2007                            DOB:          01-18-1936    Judy's endoscopy showed a prominent hiatal hernia but no evidence  Barrett's mucosa.  She currently is on daily PPI therapy and seems to be  doing well.  Colonoscopy is unremarkable except for a small hyperplastic  polyp and left colon diverticulosis.  We reviewed her findings today.  We will continue her chronic PPI therapy.  She is not desirous of  consideration of fundoplication surgery.  She seems to be doing well  with her IBS and diverticulosis and we will follow her on a p.r.n. basis  as needed.     Vania Rea. Jarold Motto, MD, Caleen Essex, FAGA  Electronically Signed    DRP/MedQ  DD: 09/24/2007  DT: 09/24/2007  Job #: [id number]   cc:   Valetta Mole. Swords, MD

## 2010-11-04 NOTE — Assessment & Plan Note (Signed)
Christina Hoover                           GASTROENTEROLOGY OFFICE NOTE   NAME:Hoover, Christina LAFOY                     MRN:          [id number]  DATE:02/05/2006                            DOB:          1935-09-16    REASON FOR CONSULTATION:  Acid reflux.   HISTORY OF PRESENT ILLNESS:  This is a 75 year old white female with chronic  abdominal complaints, for which she has been evaluated by Dr. Sheryn Hoover.  She is seeing me today at the request of her son, Christina Hoover, who is an  acquaintance.  In any event, the patient reports a greater than 20-year  history of intermittent problems with right upper quadrant pain, substernal  burning, dysphagia and globus-type sensation.  She has had her problems  attributed to reflux and a hiatal hernia.  In terms of evaluating her  intermittent right upper quadrant pain, she has undergone an extensive  evaluation, including abdominal ultrasound, CT scan of the abdomen and  pelvis, colonoscopy, and upper endoscopy.  She has also had an upper GI  series.  Ultrasound and CT scan imaging have been unremarkable.  Upper GI  series has revealed cervical spondylosis with extrinsic compression of the  cervical esophagus, and a 3 cm hiatal hernia, otherwise normal.  Upper  endoscopy has been normal except for the presence of a sliding hiatal  hernia.  She has undergone empiric esophageal dilation for complaints of  dysphagia with variable results.  For her reflux, she has been on Protonix  40 mg daily.  She was in her usual state of health until about 3 weeks ago,  when she developed a throbbing sensation in the right upper quadrant, which  she described as severe.  There was some radiation into the back on the  right.  This was typical of her pain.  Around the same time, some complaints  of squeezing in her chest, substernal burning, lump in the throat sensation,  and some hoarseness.  Despite all this, she was able to eat without  difficulties, and was able to rest at night without difficulties.  There was  no shortness of breath, diaphoresis or exertional chest pain.  She reports  to me that she might have polymyalgia rheumatica, for which she sees Dr.  Cato Hoover.  She has also undergone laparoscopy with lysis of adhesions for  chronic left pelvic pain in 1998.  She denies vomiting, changes in bowel  habits or melena.  No weight loss.   CURRENT MEDICATIONS:  1. Premarin.  2. Prozac 20 mg daily.  3. Vitamin C.  4. Protonix.  5. Recently initiated prednisone 5 mg daily.  After developing the pain, the patient increased her Protonix to b.i.d.  Currently she states that she is feeling somewhat better.   PHYSICAL EXAMINATION:  GENERAL:  Exam finds a well-appearing female in no  acute distress.  VITAL SIGNS:  Blood pressure 120/60, heart rate is 62.  Her weight was not  recorded.  HEENT:  Sclerae are anicteric, conjunctivae are pink, oral mucosa is intact.  LUNGS:  Clear.  HEART:  Regular.  ABDOMEN:  Soft without  tenderness, mass or hernia.  Good bowel sounds heard.   IMPRESSION:  1. Chronic right upper quadrant pain, most certainly functional.  May be      an element of spasm, as previously suggested.  No worrisome features.      Currently improved.  2. Probable gastroesophageal reflux disease.  Recent exacerbation, as      discussed.  Seemingly improved after increasing proton pump inhibitor      to b.i.d.   RECOMMENDATIONS:  At this point I have provided the patient with  reassurance.  I have advised that, should her reflux experience a  breakthrough, then she should indeed increase her proton pump inhibitor to  b.i.d. and to be careful with her diet.  With regards to the intermittent  right upper quadrant pain, it may be reasonable to try an antispasmodic  periodically.  I have recommended sublingual Levsin.  She will follow up as  needed.                                   Christina Hoover. Christina Hoover., MD    JNP/MedQ  DD:  02/06/2006  DT:  02/07/2006  Job #:  [id number]   cc:   Christina Hoover. Christina Mulligan, MD  Christina Rea. Jarold Motto, MD, Christina Hoover, [place]

## 2010-11-04 NOTE — Op Note (Signed)
NAME:  Christina Hoover, Christina Hoover                        ACCOUNT NO.:  [id number]   MEDICAL RECORD NO.:  [id number]                   PATIENT TYPE:  AMB   LOCATION:  DAY                                  FACILITY:  [facility]   PHYSICIAN:  Marlowe Kays, M.D.               DATE OF BIRTH:  12-May-1936   DATE OF PROCEDURE:  08/01/2002  DATE OF DISCHARGE:                                 OPERATIVE REPORT   PREOPERATIVE DIAGNOSIS:  Painful osteoarthritis, metatarsophalangeal joint,  right great toe.   POSTOPERATIVE DIAGNOSIS:  Painful osteoarthritis, metatarsophalangeal joint,  right great toe.   PROCEDURE:  Debridement of osteophytes, metatarsophalangeal joint, right  great toe.   SURGEON:  Marlowe Kays, M.D.   ASSISTANT:  Nurse.   ANESTHESIA:  General.   PATHOLOGY AND JUSTIFICATION FOR PROCEDURE:  She has had pain and loss of  motion in this joint progressive for some time.  Plain x-rays have  demonstrated narrowing of the joint with one large osteophyte in particular  off the dorsum of the first metatarsal head.  We have tried injecting the  joint but because of progressive pain, she is here today for today's present  surgery.   DESCRIPTION OF PROCEDURE:  Under satisfactory general anesthesia, the leg  was esmarched out nonsterile, tourniquet inflated, and the right foot and  ankle prepped with Duraprep, draped in a sterile field.  Made the dorsal  medial incision, the sensory nerve was protected, and the capsule was opened  in the line of the skin incision.  The MP joint was exposed with a  combination of sharp and blunt dissection.  There was a large amount of  reactive fluid in the joint.  One particular large osteophyte off the dorsum  of the first metatarsal head was removed with rongeur, which allowed better  access to the remainder of the joint.  Other residual osteophytes were  removed from the first metatarsal head and particularly laterally with a  small synovectomy rongeur.   We then removed also some small osteophytes from  the base of the proximal phalanx.  We got a good look at the joint.  The  lateral half of the first metatarsal head had some incomplete full-thickness  wear, whereas the medial half was relatively spared.  I performed a small  synovectomy of the lateral half of the joint.  At the conclusion of the  case, I could find nothing that was interfering with the joint mechanics.  The wound was irrigated well with sterile saline and soft tissues  infiltrated with 0.5% plain Marcaine.  Closure was performed with  interrupted 0 Vicryl in the capsule, interrupted 4-0 nylon mattress sutures  in the skin and subcutaneous tissue.  Betadine and Adaptic dry sterile  dressing were applied.  The tourniquet was released.  She tolerated the  procedure well and at the time of this dictation was on her way to the  recovery room  in satisfactory condition with no known complication.                                                Marlowe Kays, M.D.    JA/MEDQ  D:  08/01/2002  T:  08/01/2002  Job:  [id number]

## 2010-11-04 NOTE — Op Note (Signed)
   NAME:  Christina Hoover, Christina Hoover                        ACCOUNT NO.:  [id number]   MEDICAL RECORD NO.:  [id number]                   PATIENT TYPE:  AMB   LOCATION:  DSC                                  FACILITY:  [facility]   PHYSICIAN:  Alfredia Ferguson, M.D.               DATE OF BIRTH:  1935/11/14   DATE OF PROCEDURE:  04/02/2003  DATE OF DISCHARGE:                                 OPERATIVE REPORT   PREOPERATIVE DIAGNOSIS:  Retracted scar, 1 cm, right chin.   POSTOPERATIVE DIAGNOSIS:  Retracted scar, 1 cm, right chin.   OPERATION PERFORMED:  Excision of scar, right chin.   SURGEON:  Alfredia Ferguson, M.D.   ANESTHESIA:  2% Xylocaine with 1:100,000 epinephrine.   INDICATION FOR SURGERY:  This is a 75 year old woman with a depressed scar  in her right chin.  This was from previous surgery.  She wishes to have this  scar excised.  She understands the risk of worsening scar.  In spite of  that, she wishes to proceed.   DESCRIPTION OF OPERATION:  Skin marks were placed in an oblique fashion on  the chin following the old surgical incision.  Local anesthesia was  infiltrated, and the chin was prepped with Betadine and draped with sterile  drapes.  After waiting approximately 10 minutes, an elliptical excision of  the scar was carried out.  Specimen was passed for pathology.  The wound  edges were undermined for a distance of 4-5 mm in all directions.  Hemostasis was accomplished using pressure.  The wound was closed by  approximating the dermis using interrupted 5-0 Monocryl sutures.  Skin was  united using an interrupted 6-0 nylon suture.  Light dressing was applied,  and the patient was discharged to home in satisfactory condition.                                               Alfredia Ferguson, M.D.    WBB/MEDQ  D:  04/02/2003  T:  04/02/2003  Job:  [id number]   cc:   Dollene Cleveland, M.D.  [address] [place]  [place] [address]  Fax: [phone]

## 2010-12-12 ENCOUNTER — Other Ambulatory Visit (INDEPENDENT_AMBULATORY_CARE_PROVIDER_SITE_OTHER): Payer: Medicare Other | Admitting: Internal Medicine

## 2010-12-12 ENCOUNTER — Other Ambulatory Visit: Payer: Self-pay | Admitting: *Deleted

## 2010-12-12 DIAGNOSIS — F3289 Other specified depressive episodes: Secondary | ICD-10-CM

## 2010-12-12 DIAGNOSIS — G4700 Insomnia, unspecified: Secondary | ICD-10-CM

## 2010-12-12 DIAGNOSIS — F329 Major depressive disorder, single episode, unspecified: Secondary | ICD-10-CM

## 2010-12-12 MED ORDER — ZOLPIDEM TARTRATE 10 MG PO TABS: 10.0000 mg | ORAL_TABLET | Freq: Every evening | ORAL | Status: DC | PRN

## 2010-12-12 MED ORDER — AMLODIPINE BESYLATE 5 MG PO TABS: 5.0000 mg | ORAL_TABLET | Freq: Every day | ORAL | Status: DC

## 2010-12-12 MED ORDER — FLUOXETINE HCL 20 MG PO TABS: 20.0000 mg | ORAL_TABLET | ORAL | Status: DC

## 2010-12-12 MED ORDER — BUPROPION HCL ER (SR) 150 MG PO TB12: 150.0000 mg | ORAL_TABLET | Freq: Two times a day (BID) | ORAL | Status: DC

## 2010-12-12 MED ORDER — SUCRALFATE 1 GM/10ML PO SUSP: 1.0000 g | ORAL | Status: DC | PRN

## 2010-12-12 NOTE — Telephone Encounter (Signed)
Refill Bupriopion 150mg  , Amlodipine 5mg , Flouxetine 20mg , Caratate susp 1gram-10mg  to [facility].

## 2010-12-12 NOTE — Telephone Encounter (Signed)
rx sent in electronically 

## 2010-12-15 ENCOUNTER — Other Ambulatory Visit: Payer: Self-pay | Admitting: *Deleted

## 2010-12-15 MED ORDER — FLUOXETINE HCL 20 MG PO CAPS: 20.0000 mg | ORAL_CAPSULE | Freq: Every day | ORAL | Status: DC

## 2010-12-18 HISTORY — PX: BACK SURGERY: SHX140

## 2011-01-04 ENCOUNTER — Inpatient Hospital Stay (HOSPITAL_COMMUNITY): Payer: Medicare Other

## 2011-01-04 ENCOUNTER — Inpatient Hospital Stay (HOSPITAL_COMMUNITY)
Admission: RE | Admit: 2011-01-04 | Discharge: 2011-01-07 | DRG: 491 | Disposition: A | Discharge status: Home Health | Payer: Medicare Other | Source: Ambulatory Visit | Attending: Orthopedic Surgery | Admitting: Orthopedic Surgery

## 2011-01-04 DIAGNOSIS — R52 Pain, unspecified: Secondary | ICD-10-CM

## 2011-01-04 DIAGNOSIS — I10 Essential (primary) hypertension: Secondary | ICD-10-CM | POA: Diagnosis present

## 2011-01-04 DIAGNOSIS — M5126 Other intervertebral disc displacement, lumbar region: Secondary | ICD-10-CM | POA: Diagnosis present

## 2011-01-04 DIAGNOSIS — IMO0001 Reserved for inherently not codable concepts without codable children: Secondary | ICD-10-CM | POA: Diagnosis present

## 2011-01-04 DIAGNOSIS — M48061 Spinal stenosis, lumbar region without neurogenic claudication: Principal | ICD-10-CM | POA: Diagnosis present

## 2011-01-04 LAB — BASIC METABOLIC PANEL
Chloride: 104 mEq/L (ref 96–112)
Creatinine, Ser: 0.74 mg/dL (ref 0.50–1.10)
GFR calc Af Amer: >60 mL/min (ref >60)
GFR calc non Af Amer: >60 mL/min (ref >60)
Potassium: 4.1 mEq/L (ref 3.5–5.1)

## 2011-01-04 LAB — CBC
MCHC: 32.2 g/dL (ref 30.0–36.0)
MCV: 92.1 fL (ref 78.0–100.0)
Platelets: 251 10*3/uL (ref 150–400)
RDW: 13.4 % (ref 11.5–15.5)
WBC: 9.5 10*3/uL (ref 4.0–10.5)

## 2011-01-04 LAB — TYPE AND SCREEN: ABO/RH(D): O POS

## 2011-01-04 LAB — ABO/RH: ABO/RH(D): O POS

## 2011-01-16 ENCOUNTER — Telehealth: Payer: Self-pay | Admitting: Internal Medicine

## 2011-01-16 NOTE — Telephone Encounter (Signed)
Don't see zomig on med list

## 2011-01-16 NOTE — Telephone Encounter (Signed)
Refill Zomig 5mg  to [facility].   Also, patient wanted to let Dr Cato Mulligan know that she had an emergency back surgery.

## 2011-01-17 MED ORDER — ZOLMITRIPTAN 5 MG PO TABS: 5.0000 mg | ORAL_TABLET | ORAL | Status: DC | PRN

## 2011-01-17 NOTE — Telephone Encounter (Signed)
zomig 5 mg po prn, take at onset of headache

## 2011-01-17 NOTE — Telephone Encounter (Signed)
rx sent in electronically 

## 2011-01-20 NOTE — Op Note (Signed)
NAMEDAKSHA, KOONE NO.:  [id number]  MEDICAL RECORD NO.:  [id number]  LOCATION:  1525                         FACILITY:  [facility]  PHYSICIAN:  Marlowe Kays, M.D.  DATE OF BIRTH:  1936-06-12  DATE OF PROCEDURE:  01/04/2011 DATE OF DISCHARGE:                              OPERATIVE REPORT   PREOPERATIVE DIAGNOSES: 1. Significant spinal stenosis at L3-4, L4-5. 2. Suspected herniated nucleus pulposus at L2-3, right with extrusion     of fragment. 3. Extruded disk herniation at L4-5 on right. 4. Small posterolateral foraminal protrusion at L5-S1.  POSTOPERATIVE DIAGNOSES: 1. Spinal stenosis L3-4 and L4-5. 2. Extruded disk fragment at L4-5 on right. 3. No definite disk herniations noted at L2-3 or at L5-S1 on right.  OPERATION: 1. Decompressive laminectomy at L2-3 central into the right; L3-4, L4-     5 and L5-S1 central into the right. 2. Microdiskectomy, L4-5, on right. 3. Inspection of L2-3 and L5-S1 disks, right.  SURGEON:  Marlowe Kays, MD  ASSISTANT:  Georges Lynch. Gioffre, MD  ANESTHESIA:  General.  PLAN/JUSTIFICATION FOR PROCEDURE:  She has had a long history of back and leg pain primarily right leg.  She had a lumbar MRI on August 20, 2010, which demonstrated severe spinal stenosis at L3-4, L4-5, but no disk herniation.  Recently she has had significant worsening of her right leg pain and this led to a second MRI on January 02, 2011, with the preoperative diagnoses.  She is basically incapacitated on bed rest because of the pain leading to the surgery.  PROCEDURE:  Satisfied general anesthesia, Foley catheter inserted, prophylactic antibiotics, placed prone on the Wilson frame, back was prepped with DuraPrep and draped in sterile field.  Timeout performed. Midline incision and one side exposed number of the spinous processes, the first of number of x-rays were taken and Kocher clamps were noted on L3 and L5.  Based on this, I continued dissection  to either side exposing L2 to the sacrum.  We then began our decompressive laminectomy, working at removing the spinous processes of L3, L4 and L5 with double- action rongeur and then bringing in the microscope to complete the decompression.  At L4-5, on the right, we went a little wider with the decompression and were able to locate the L5 nerve root and move the dura medially.  The L4-5 disk was identified and opened with the 15 x 9 blade.  The disk material came forth under pressure.  We cleared the disk space of all disk material obtainable.  The L5 nerve root was then found to be well decompressed to hockey-stick.  I then continued the decompression to the right removing the lamina of L5 and then find the S1 nerve root which we mobilized medially.  The disk beneath it appeared to be relatively flat.  The foramen was widely patent and S1 nerve root movable.  So we felt we did not need to do any additional work at this level.  We then moved caudal and identified L2-3 and with one of number of x-rays went right to the location of what appeared to be extruded fragment lying caudal to the body of L3.  The L3  nerve root appeared to be irritable.  We decompressed this far laterally as we could without doing major destruction of bone.  We were unable to find any definite fragment but we did widely decompress both the L2 and L3 nerve roots to hockey-stick so that we felt that we had taken off any pressure that might have occurred should there be a fragment that we just could not find.  Also of consideration was not to create any iatrogenic injury. We irrigated the wound well with sterile saline as we had throughout the case.  We placed Gelfoam soaked in thrombin over the dura.  Self- retaining retractors were carefully removed.  There was no unusual bleeding.  We closed the wound in layers with interrupted #1 Vicryl with staples on the skin.  Betadine, Adaptic, dry sterile dressing  were applied.  At the time of this dictation, she was being awakened to be taken to the recovery room and was in satisfactory condition.  Estimated blood loss was perhaps 350 cc with no blood replaced.          ______________________________ Marlowe Kays, M.D.     JA/MEDQ  D:  01/04/2011  T:  01/04/2011  Job:  [id number]  Electronically Signed by Marlowe Kays M.D. on 01/20/2011 04:20:26 PM

## 2011-01-26 ENCOUNTER — Telehealth: Payer: Self-pay | Admitting: Internal Medicine

## 2011-01-26 NOTE — Telephone Encounter (Signed)
I don't see Lyrica on the med list

## 2011-01-26 NOTE — Telephone Encounter (Signed)
Refill lyrica to [facility]. Thanks.

## 2011-01-27 MED ORDER — PREGABALIN 150 MG PO CAPS: 150.0000 mg | ORAL_CAPSULE | Freq: Three times a day (TID) | ORAL | Status: DC

## 2011-01-27 NOTE — Telephone Encounter (Signed)
rx sent in, pt aware 

## 2011-01-27 NOTE — Telephone Encounter (Signed)
Ask her about the dose. Okay to refill. Place on the medication list.

## 2011-01-30 NOTE — H&P (Signed)
  NAMEODIS, WICKEY NO.:  [id number]  MEDICAL RECORD NO.:  [id number]  LOCATION:  1525                         FACILITY:  [facility]  PHYSICIAN:  Marlowe Kays, M.D.  DATE OF BIRTH:  Dec 08, 1935  DATE OF ADMISSION:  01/04/2011 DATE OF DISCHARGE:  01/07/2011                             HISTORY & PHYSICAL   This history and physical dictated from inpatient chart information.  BRIEF HISTORY:  This 75 year old white female seen by Dr. Simonne Come, for continued progressive problems concerning pain in her back and lower extremities.  We had tried certain conservative treatments and antiinflammatories, but unfortunately the patient continued with pain and discomfort to the point where she had continuing interference with her day-to-day activities.  A lumbar MRI on March 3rd, showed severe spinal stenosis L3-L4, L4-L5 with no herniation.  Since she had recent worsening of her right leg pain, another MRI was performed on July 16th and had spinal stenosis L3-L4, L4-L5 with extruded disk fragment L4-5 on the right.  She is essentially at bedrest and really is having a considerable amount of problem just getting about, so decided to go ahead with the surgical intervention.  We all discussed a decompressive laminectomy at L2-3, central to the right at L3-4, L4-5 with microdiskectomy at L4-5.  It is also planned to inject at L2-3 and L5-S1 disks on the right.  CURRENT MEDICATIONS:  Vitamin C, fish oil and vitamin B complex, vitamin D3, calcium carbonate, prednisone as a Dosepak, which she started to try to get over her pain. Bupropion SR 150 mg daily, Premarin conjugated estrogens 0.625 mg daily, Lyrica 150 mg daily, fluoxetine 20 mg dose in the evening, amlodipine 5 mg postop in the evening, oxycodone for discomfort and tramadol for discomfort as well.  She is on Advair Diskus at home and Dexilant 60 mg every morning and zolpidem 10 mg dose at bedtime.  REVIEW OF SYSTEMS:   Essentially negative other than present illness.  SOCIAL HISTORY:  No intake of alcohol, tobacco products.  PHYSICAL EXAMINATION:  VITAL SIGNS:  Blood pressure 155/76, pulse 70, heart rate 97, respirations 18. HEENT:  Normocephalic.  PERRLA.  EOM intact.  Oropharynx is clear. CHEST:  Clear to auscultation.  No rhonchi, no rales. HEART:  Regular rate and rhythm.  No murmurs are heard. ABDOMEN:  Soft, nontender. GENITALIA:  Not done EXTREMITIES: Negative straight leg raise bilaterally. RECTAL/PELVIS/BREASTS:  Not done, not pertinent to present illness.  ADMITTING DIAGNOSES:  Spinal stenosis L2, L3-L4, L4-L5 with extruded disks at L2-L3, L4-L5, L5-S1 on the right.  PLAN:  The patient will be admitted for decompressive lumbar laminectomy with microdiskectomy primarily at L2-L3, L3-L4, L4-L5, L5-S1 with microdiskectomy of the L4-5.  Dr. Darrelyn Hillock will assist.     Druscilla Brownie. Cherlynn June.   ______________________________ Marlowe Kays, M.D.    DLU/MEDQ  D:  01/20/2011  T:  01/21/2011  Job:  [id number]  cc:   Marlowe Kays, M.D. Fax: [phone]  Electronically Signed by Marlowe Kays M.D. on 01/30/2011 01:09:16 PM

## 2011-02-03 ENCOUNTER — Other Ambulatory Visit: Payer: Self-pay | Admitting: *Deleted

## 2011-02-03 MED ORDER — ZOLMITRIPTAN 5 MG PO TABS: 5.0000 mg | ORAL_TABLET | ORAL | Status: DC | PRN

## 2011-03-01 ENCOUNTER — Ambulatory Visit: Payer: Medicare Other | Admitting: Physical Therapy

## 2011-03-09 ENCOUNTER — Ambulatory Visit: Payer: Medicare Other | Admitting: Rehabilitative and Restorative Service Providers"

## 2011-03-09 ENCOUNTER — Encounter: Payer: Medicare Other | Admitting: Rehabilitative and Restorative Service Providers"

## 2011-03-10 ENCOUNTER — Encounter: Payer: Medicare Other | Admitting: Physical Therapy

## 2011-03-14 ENCOUNTER — Encounter: Payer: Medicare Other | Admitting: Physical Therapy

## 2011-03-15 ENCOUNTER — Encounter: Payer: Medicare Other | Admitting: Physical Therapy

## 2011-03-15 ENCOUNTER — Other Ambulatory Visit: Payer: Self-pay

## 2011-03-15 MED ORDER — METOCLOPRAMIDE HCL 10 MG PO TABS: ORAL_TABLET | ORAL | Status: DC

## 2011-03-17 ENCOUNTER — Encounter: Payer: Medicare Other | Admitting: Physical Therapy

## 2011-03-20 ENCOUNTER — Encounter: Payer: Medicare Other | Admitting: Rehabilitative and Restorative Service Providers"

## 2011-03-22 ENCOUNTER — Other Ambulatory Visit: Payer: Self-pay | Admitting: Gastroenterology

## 2011-03-22 ENCOUNTER — Encounter: Payer: Medicare Other | Admitting: Rehabilitative and Restorative Service Providers"

## 2011-03-22 MED ORDER — DEXLANSOPRAZOLE 60 MG PO CPDR: 60.0000 mg | DELAYED_RELEASE_CAPSULE | Freq: Every day | ORAL | Status: DC

## 2011-03-22 NOTE — Telephone Encounter (Signed)
Rx sent to [facility].

## 2011-03-27 ENCOUNTER — Encounter: Payer: Medicare Other | Admitting: Physical Therapy

## 2011-03-29 ENCOUNTER — Encounter: Payer: Medicare Other | Admitting: Physical Therapy

## 2011-04-03 ENCOUNTER — Encounter: Payer: Medicare Other | Admitting: Rehabilitative and Restorative Service Providers"

## 2011-04-05 ENCOUNTER — Encounter: Payer: Medicare Other | Admitting: Rehabilitative and Restorative Service Providers"

## 2011-04-21 ENCOUNTER — Other Ambulatory Visit: Payer: Self-pay | Admitting: Internal Medicine

## 2011-05-16 ENCOUNTER — Telehealth: Payer: Self-pay | Admitting: Internal Medicine

## 2011-05-16 MED ORDER — ZOSTER VACCINE LIVE 19400 UNT/0.65ML ~~LOC~~ SOLR: 0.6500 mL | Freq: Once | SUBCUTANEOUS | Status: AC

## 2011-05-16 NOTE — Telephone Encounter (Signed)
Pt would like a rx for shingle vaccine. Please call pt when rx is ready for pick up.

## 2011-05-16 NOTE — Telephone Encounter (Signed)
rx sent in electronically 

## 2011-05-23 ENCOUNTER — Telehealth: Payer: Self-pay | Admitting: Internal Medicine

## 2011-05-23 NOTE — Telephone Encounter (Signed)
Please advise 

## 2011-05-23 NOTE — Telephone Encounter (Signed)
Pt isnt feeling well. Fever, cough feels bad over all. Pt is wanting an rx called in. Pt requesting you contact her

## 2011-05-24 NOTE — Telephone Encounter (Signed)
Per husband pt is not doing well at all. Husband(tom) stated this happens every yr. Tom cell#  [phone]

## 2011-05-24 NOTE — Telephone Encounter (Signed)
Call, see how she is doing?

## 2011-05-25 MED ORDER — DOXYCYCLINE HYCLATE 100 MG PO TABS: 100.0000 mg | ORAL_TABLET | Freq: Two times a day (BID) | ORAL | Status: AC

## 2011-05-25 NOTE — Telephone Encounter (Signed)
Probable sinusitis

## 2011-05-29 ENCOUNTER — Other Ambulatory Visit: Payer: Self-pay | Admitting: Internal Medicine

## 2011-06-02 ENCOUNTER — Other Ambulatory Visit: Payer: Self-pay | Admitting: *Deleted

## 2011-06-02 DIAGNOSIS — G4700 Insomnia, unspecified: Secondary | ICD-10-CM

## 2011-06-02 MED ORDER — ZOLPIDEM TARTRATE 10 MG PO TABS: 10.0000 mg | ORAL_TABLET | Freq: Every evening | ORAL | Status: DC | PRN

## 2011-06-28 ENCOUNTER — Ambulatory Visit: Payer: Medicare Other | Admitting: Internal Medicine

## 2011-07-06 ENCOUNTER — Ambulatory Visit: Payer: Medicare Other | Admitting: Internal Medicine

## 2011-07-07 ENCOUNTER — Encounter: Payer: Self-pay | Admitting: Internal Medicine

## 2011-07-07 ENCOUNTER — Ambulatory Visit (INDEPENDENT_AMBULATORY_CARE_PROVIDER_SITE_OTHER): Payer: Medicare Other | Admitting: Internal Medicine

## 2011-07-07 DIAGNOSIS — F329 Major depressive disorder, single episode, unspecified: Secondary | ICD-10-CM

## 2011-07-07 DIAGNOSIS — I10 Essential (primary) hypertension: Secondary | ICD-10-CM

## 2011-07-07 NOTE — Progress Notes (Signed)
Patient ID: Christina Hoover, female   DOB: [birth year], 76 y.o.   MRN: [id number] Recent back surgery (12/2010)---recovering slowly  htn---tolerating meds  GERD--no sxs on meds  Past Medical History  Diagnosis Date  . GERD (gastroesophageal reflux disease)   . History of colonic polyps   . Atypical mycobacterial infection   . Hypertension   . Myalgia and myositis, unspecified   . Hyperlipidemia   . Headache   . Depression   . Asthma   . Anxiety     History   Social History  . Marital Status: Married    Spouse Name: N/A    Number of Children: N/A  . Years of Education: N/A   Occupational History  . Not on file.   Social History Main Topics  . Smoking status: Never Smoker   . Smokeless tobacco: Not on file  . Alcohol Use:   . Drug Use:   . Sexually Active:    Other Topics Concern  . Not on file   Social History Narrative  . No narrative on file    Past Surgical History  Procedure Date  . Total abdominal hysterectomy w/ bilateral salpingoophorectomy   . Knee surgery     Left  . Breast surgery   . Toe spur     right toe spur removed  . Appendectomy   . Replacement total knee     Right 2009/Left   . Cataract extraction, bilateral   . Back surgery 12/2010    [place]    Family History  Problem Relation Age of Onset  . Hypertension Mother   . Heart failure Mother   . Stroke Father 22  . Hypertension Sister   . Hypertension Brother     Allergies  Allergen Reactions  . Lisinopril     REACTION: hacky cough    Current Outpatient Prescriptions on File Prior to Visit  Medication Sig Dispense Refill  . amLODipine (NORVASC) 5 MG tablet TAKE 1 TABLET DAILY  90 tablet  0  . Ascorbic Acid (VITAMIN C) 500 MG tablet Take 500 mg by mouth daily.        . B Complex Vitamins (B COMPLEX 100) tablet Take 1 tablet by mouth daily.        . Cholecalciferol (VITAMIN D3) 1000 UNITS capsule Take 1,000 Units by mouth daily.        Marland Kitchen dexlansoprazole (DEXILANT) 60 MG  capsule Take 1 capsule (60 mg total) by mouth daily before breakfast.  90 capsule  3  . diazepam (VALIUM) 5 MG tablet Take 5 mg by mouth daily. 1 by mouth once daily for restless leg       . estrogens, conjugated, (PREMARIN) 0.625 MG tablet Take 0.625 mg by mouth daily. Take daily for 21 days then do not take for 7 days.       . ferrous sulfate 325 (65 FE) MG tablet Take 325 mg by mouth daily.        Marland Kitchen FLUoxetine (PROZAC) 20 MG capsule Take 1 capsule (20 mg total) by mouth daily.  90 capsule  1  . Fluticasone-Salmeterol (ADVAIR DISKUS) 250-50 MCG/DOSE AEPB Inhale 1 puff into the lungs daily. 1 puff daily      . pregabalin (LYRICA) 150 MG capsule Take 1 capsule (150 mg total) by mouth 3 (three) times daily.  270 capsule  0  . sucralfate (CARAFATE) 1 GM/10ML suspension Take 10 mLs (1 g total) by mouth as needed.  420 mL  1  .  zolmitriptan (ZOMIG) 5 MG tablet Take 1 tablet (5 mg total) by mouth as needed for migraine.  24 tablet  0  . zolpidem (AMBIEN) 10 MG tablet Take 1 tablet (10 mg total) by mouth at bedtime as needed.  90 tablet  0     patient denies chest pain, shortness of breath, orthopnea. Denies lower extremity edema, abdominal pain, change in appetite, change in bowel movements. Patient denies rashes, musculoskeletal complaints. No other specific complaints in a complete review of systems.   BP 142/82  Pulse 83  Temp(Src) 98.4 F (36.9 C) (Oral)  Wt 137 lb (62.143 kg)  Well-developed well-nourished female in no acute distress. HEENT exam atraumatic, normocephalic, extraocular muscles are intact. Neck is supple. No jugular venous distention no thyromegaly. Chest clear to auscultation without increased work of breathing. Cardiac exam S1 and S2 are regular. 2/6 SEM. Abdominal exam active bowel sounds, soft, nontender. Extremities no edema.

## 2011-07-09 NOTE — Assessment & Plan Note (Signed)
Doing quite well. 

## 2011-07-09 NOTE — Assessment & Plan Note (Signed)
BP Readings from Last 3 Encounters:  07/07/11 142/82  10/07/10 124/84  09/22/10 130/78   Fair control Will continue to monitor

## 2011-07-18 ENCOUNTER — Other Ambulatory Visit: Payer: Self-pay | Admitting: *Deleted

## 2011-07-18 DIAGNOSIS — G4700 Insomnia, unspecified: Secondary | ICD-10-CM

## 2011-07-18 MED ORDER — ESTROGENS CONJUGATED 0.625 MG PO TABS: 0.6250 mg | ORAL_TABLET | Freq: Every day | ORAL | Status: DC

## 2011-07-18 MED ORDER — FLUOXETINE HCL 20 MG PO CAPS: 20.0000 mg | ORAL_CAPSULE | Freq: Every day | ORAL | Status: DC

## 2011-07-18 MED ORDER — SUCRALFATE 1 GM/10ML PO SUSP: 1.0000 g | ORAL | Status: DC | PRN

## 2011-07-18 MED ORDER — DEXLANSOPRAZOLE 60 MG PO CPDR: 60.0000 mg | DELAYED_RELEASE_CAPSULE | Freq: Every day | ORAL | Status: DC

## 2011-07-18 MED ORDER — AMLODIPINE BESYLATE 5 MG PO TABS: 5.0000 mg | ORAL_TABLET | Freq: Every day | ORAL | Status: DC

## 2011-07-18 MED ORDER — ZOLPIDEM TARTRATE 10 MG PO TABS: 10.0000 mg | ORAL_TABLET | Freq: Every evening | ORAL | Status: DC | PRN

## 2011-07-18 MED ORDER — PREGABALIN 150 MG PO CAPS: 150.0000 mg | ORAL_CAPSULE | Freq: Three times a day (TID) | ORAL | Status: DC

## 2011-07-18 MED ORDER — BUPROPION HCL ER (SR) 150 MG PO TB12: 150.0000 mg | ORAL_TABLET | Freq: Every day | ORAL | Status: DC

## 2011-07-18 MED ORDER — FLUTICASONE-SALMETEROL 250-50 MCG/DOSE IN AEPB: 1.0000 | INHALATION_SPRAY | Freq: Every day | RESPIRATORY_TRACT | Status: DC

## 2011-07-19 ENCOUNTER — Other Ambulatory Visit: Payer: Self-pay | Admitting: Internal Medicine

## 2011-07-24 ENCOUNTER — Other Ambulatory Visit: Payer: Self-pay | Admitting: Internal Medicine

## 2011-07-24 NOTE — Telephone Encounter (Signed)
Pt needs new rx zolpidem tartrate 10mg  #90 and fluoxetine 20 mg #90 with 3 refills [facility]

## 2011-07-27 NOTE — Telephone Encounter (Signed)
This was already done

## 2011-08-22 ENCOUNTER — Other Ambulatory Visit: Payer: Self-pay | Admitting: *Deleted

## 2011-08-22 DIAGNOSIS — G4700 Insomnia, unspecified: Secondary | ICD-10-CM

## 2011-08-30 DIAGNOSIS — Q762 Congenital spondylolisthesis: Secondary | ICD-10-CM | POA: Insufficient documentation

## 2011-08-30 DIAGNOSIS — M5126 Other intervertebral disc displacement, lumbar region: Secondary | ICD-10-CM | POA: Insufficient documentation

## 2011-09-14 ENCOUNTER — Telehealth: Payer: Self-pay | Admitting: *Deleted

## 2011-09-14 DIAGNOSIS — G4700 Insomnia, unspecified: Secondary | ICD-10-CM

## 2011-09-14 MED ORDER — ZOLPIDEM TARTRATE 10 MG PO TABS: 10.0000 mg | ORAL_TABLET | Freq: Every evening | ORAL | Status: DC | PRN

## 2011-09-14 NOTE — Telephone Encounter (Signed)
rx faxed to pharmacy

## 2011-09-14 NOTE — Telephone Encounter (Signed)
Pt is asking for a 3 mos supply of Generic Ambien 10 mg. Called t [facility].  Please call pt with any questions.

## 2011-09-15 DIAGNOSIS — M961 Postlaminectomy syndrome, not elsewhere classified: Secondary | ICD-10-CM | POA: Insufficient documentation

## 2011-09-26 ENCOUNTER — Telehealth: Payer: Self-pay | Admitting: *Deleted

## 2011-09-26 DIAGNOSIS — G4700 Insomnia, unspecified: Secondary | ICD-10-CM

## 2011-09-26 MED ORDER — ZOLPIDEM TARTRATE 10 MG PO TABS: 10.0000 mg | ORAL_TABLET | Freq: Every evening | ORAL | Status: DC | PRN

## 2011-09-26 NOTE — Telephone Encounter (Signed)
They are unable to verify the qty of the zolpidem.  Refaxed rx to pharmacy

## 2011-10-18 ENCOUNTER — Other Ambulatory Visit: Payer: Self-pay | Admitting: *Deleted

## 2011-10-18 MED ORDER — PREGABALIN 150 MG PO CAPS: 150.0000 mg | ORAL_CAPSULE | Freq: Three times a day (TID) | ORAL | Status: DC

## 2011-11-28 ENCOUNTER — Other Ambulatory Visit: Payer: Self-pay | Admitting: *Deleted

## 2011-11-28 DIAGNOSIS — G4700 Insomnia, unspecified: Secondary | ICD-10-CM

## 2011-11-28 MED ORDER — ZOLPIDEM TARTRATE 10 MG PO TABS: 10.0000 mg | ORAL_TABLET | Freq: Every evening | ORAL | Status: DC | PRN

## 2012-01-02 ENCOUNTER — Ambulatory Visit (INDEPENDENT_AMBULATORY_CARE_PROVIDER_SITE_OTHER): Payer: Medicare Other | Admitting: Internal Medicine

## 2012-01-02 ENCOUNTER — Ambulatory Visit: Payer: Medicare Other | Admitting: Internal Medicine

## 2012-01-02 ENCOUNTER — Encounter: Payer: Self-pay | Admitting: Internal Medicine

## 2012-01-02 VITALS — BP 122/72 | HR 88 | Temp 97.8°F | Wt 145.0 lb

## 2012-01-02 DIAGNOSIS — E785 Hyperlipidemia, unspecified: Secondary | ICD-10-CM

## 2012-01-02 DIAGNOSIS — A319 Mycobacterial infection, unspecified: Secondary | ICD-10-CM

## 2012-01-02 LAB — CBC WITH DIFFERENTIAL/PLATELET
Basophils Absolute: 0.1 10*3/uL (ref 0.0–0.1)
Eosinophils Relative: 6.8 % — ABNORMAL HIGH (ref 0.0–5.0)
Hemoglobin: 14.2 g/dL (ref 12.0–15.0)
Lymphocytes Relative: 20.6 % (ref 12.0–46.0)
Monocytes Relative: 6.5 % (ref 3.0–12.0)
Neutro Abs: 3.7 10*3/uL (ref 1.4–7.7)
Platelets: 232 10*3/uL (ref 150.0–400.0)
RDW: 13.5 % (ref 11.5–14.6)
WBC: 5.7 10*3/uL (ref 4.5–10.5)

## 2012-01-02 LAB — BASIC METABOLIC PANEL
CO2: 27 mEq/L (ref 19–32)
Chloride: 108 mEq/L (ref 96–112)
Glucose, Bld: 86 mg/dL (ref 70–99)
Sodium: 142 mEq/L (ref 135–145)

## 2012-01-02 LAB — HEPATIC FUNCTION PANEL
ALT: 15 U/L (ref 0–35)
Alkaline Phosphatase: 64 U/L (ref 39–117)
Bilirubin, Direct: 0 mg/dL (ref 0.0–0.3)
Total Protein: 6.5 g/dL (ref 6.0–8.3)

## 2012-01-02 NOTE — Assessment & Plan Note (Signed)
hdl has always been high

## 2012-01-02 NOTE — Assessment & Plan Note (Signed)
Has resolved after long term ABX Will check labs today Remove problem from list

## 2012-01-02 NOTE — Progress Notes (Signed)
Patient ID: Christina Hoover, female   DOB: 1936/06/08, 76 y.o.   MRN: [id number] Htn-- currently no meds  Mood- doing well on meds  GERD- no sxs on meds  Atypical mycobacterium infection-  No recurrent sxs  Past Medical History  Diagnosis Date  . GERD (gastroesophageal reflux disease)   . History of colonic polyps   . Atypical mycobacterial infection   . Hypertension   . Myalgia and myositis, unspecified   . Hyperlipidemia   . Headache   . Depression   . Asthma   . Anxiety     History   Social History  . Marital Status: Married    Spouse Name: N/A    Number of Children: N/A  . Years of Education: N/A   Occupational History  . Not on file.   Social History Main Topics  . Smoking status: Never Smoker   . Smokeless tobacco: Not on file  . Alcohol Use:   . Drug Use:   . Sexually Active:    Other Topics Concern  . Not on file   Social History Narrative  . No narrative on file    Past Surgical History  Procedure Date  . Total abdominal hysterectomy w/ bilateral salpingoophorectomy   . Knee surgery     Left  . Breast surgery   . Toe spur     right toe spur removed  . Appendectomy   . Replacement total knee     Right 2009/Left   . Cataract extraction, bilateral   . Back surgery 12/2010    [place]    Family History  Problem Relation Age of Onset  . Hypertension Mother   . Heart failure Mother   . Stroke Father 49  . Hypertension Sister   . Hypertension Brother     Allergies  Allergen Reactions  . Lisinopril     REACTION: hacky cough    Current Outpatient Prescriptions on File Prior to Visit  Medication Sig Dispense Refill  . amLODipine (NORVASC) 5 MG tablet Take 1 tablet (5 mg total) by mouth daily.  90 tablet  3  . Ascorbic Acid (VITAMIN C) 500 MG tablet Take 500 mg by mouth daily.        . B Complex Vitamins (B COMPLEX 100) tablet Take 1 tablet by mouth daily.        Marland Kitchen buPROPion (WELLBUTRIN SR) 150 MG 12 hr tablet Take 1 tablet (150 mg  total) by mouth daily.  90 tablet  3  . Cholecalciferol (VITAMIN D3) 1000 UNITS capsule Take 1,000 Units by mouth daily.        Marland Kitchen dexlansoprazole (DEXILANT) 60 MG capsule Take 1 capsule (60 mg total) by mouth daily before breakfast.  90 capsule  3  . diazepam (VALIUM) 5 MG tablet TAKE 1 TABLET AT BEDTIME IF NEEDED FOR PAIN.  30 tablet  2  . estrogens, conjugated, (PREMARIN) 0.625 MG tablet Take 1 tablet (0.625 mg total) by mouth daily.  90 tablet  3  . FLUoxetine (PROZAC) 20 MG capsule Take 1 capsule (20 mg total) by mouth daily.  90 capsule  3  . Fluticasone-Salmeterol (ADVAIR DISKUS) 250-50 MCG/DOSE AEPB Inhale 1 puff into the lungs daily. 1 puff daily  180 each  3  . pregabalin (LYRICA) 150 MG capsule Take 1 capsule (150 mg total) by mouth 3 (three) times daily.  270 capsule  0  . REGLAN 10 MG tablet TAKE 1/2 TO 1 TABLET DAILY AS NEEDED FOR BLOATING  30  each  5  . sucralfate (CARAFATE) 1 GM/10ML suspension Take 10 mLs (1 g total) by mouth as needed.  420 mL  3  . zolmitriptan (ZOMIG) 5 MG tablet Take 1 tablet (5 mg total) by mouth as needed for migraine.  24 tablet  0  . zolpidem (AMBIEN) 10 MG tablet Take 1 tablet (10 mg total) by mouth at bedtime as needed.  90 tablet  0     patient denies chest pain, shortness of breath, orthopnea. Denies lower extremity edema, abdominal pain, change in appetite, change in bowel movements. Patient denies rashes, musculoskeletal complaints. No other specific complaints in a complete review of systems.   BP 122/72  Pulse 88  Temp 97.8 F (36.6 C) (Oral)  Wt 145 lb (65.772 kg)  Well-developed well-nourished female in no acute distress. HEENT exam atraumatic, normocephalic, extraocular muscles are intact. Neck is supple. No jugular venous distention no thyromegaly. Chest clear to auscultation without increased work of breathing. Cardiac exam S1 and S2 are regular. Abdominal exam active bowel sounds, soft, nontender. Extremities no edema. Neurologic exam she is  alert without any motor sensory deficits. Gait is normal.

## 2012-02-08 ENCOUNTER — Telehealth: Payer: Self-pay | Admitting: Internal Medicine

## 2012-02-08 NOTE — Telephone Encounter (Signed)
Pt just went to Orthopedic doctor, Christina Hoover, re: getting shoulder surgery. Pt said that Dr Ranell Patrick said for pt to call her pcp and have Dr Cato Mulligan release pt to have surgery.

## 2012-02-08 NOTE — Telephone Encounter (Signed)
Pt was last seen 01/04/12 but last EKG was 12/2010.  Ok to release for surgery?

## 2012-02-09 NOTE — Telephone Encounter (Signed)
Medical clearance form faxed.

## 2012-02-09 NOTE — Telephone Encounter (Signed)
Ok for surgery

## 2012-02-22 ENCOUNTER — Other Ambulatory Visit: Payer: Self-pay | Admitting: Gastroenterology

## 2012-02-23 MED ORDER — DEXLANSOPRAZOLE 60 MG PO CPDR: 60.0000 mg | DELAYED_RELEASE_CAPSULE | Freq: Every day | ORAL | Status: DC

## 2012-02-23 NOTE — Telephone Encounter (Signed)
Notified patient I sent in one 90 day supply for Dexilant but it has been over a year since she last saw Dr. Jarold Motto and transferred patient to appointments.

## 2012-03-11 ENCOUNTER — Other Ambulatory Visit: Payer: Self-pay | Admitting: *Deleted

## 2012-03-11 DIAGNOSIS — G4700 Insomnia, unspecified: Secondary | ICD-10-CM

## 2012-03-11 MED ORDER — ZOLPIDEM TARTRATE 10 MG PO TABS: 10.0000 mg | ORAL_TABLET | Freq: Every evening | ORAL | Status: DC | PRN

## 2012-03-11 MED ORDER — PREGABALIN 150 MG PO CAPS: 150.0000 mg | ORAL_CAPSULE | Freq: Three times a day (TID) | ORAL | Status: DC

## 2012-03-12 ENCOUNTER — Other Ambulatory Visit: Payer: Self-pay | Admitting: Internal Medicine

## 2012-03-12 DIAGNOSIS — R2689 Other abnormalities of gait and mobility: Secondary | ICD-10-CM

## 2012-03-18 ENCOUNTER — Ambulatory Visit: Payer: Medicare Other | Attending: Internal Medicine

## 2012-03-18 DIAGNOSIS — R269 Unspecified abnormalities of gait and mobility: Secondary | ICD-10-CM | POA: Insufficient documentation

## 2012-03-18 DIAGNOSIS — IMO0001 Reserved for inherently not codable concepts without codable children: Secondary | ICD-10-CM | POA: Insufficient documentation

## 2012-03-18 DIAGNOSIS — M6281 Muscle weakness (generalized): Secondary | ICD-10-CM | POA: Insufficient documentation

## 2012-03-19 ENCOUNTER — Other Ambulatory Visit: Payer: Self-pay | Admitting: Internal Medicine

## 2012-03-25 ENCOUNTER — Other Ambulatory Visit: Payer: Self-pay | Admitting: *Deleted

## 2012-03-25 ENCOUNTER — Ambulatory Visit: Payer: Medicare Other | Attending: Internal Medicine | Admitting: Physical Therapy

## 2012-03-25 DIAGNOSIS — IMO0001 Reserved for inherently not codable concepts without codable children: Secondary | ICD-10-CM | POA: Insufficient documentation

## 2012-03-25 DIAGNOSIS — M6281 Muscle weakness (generalized): Secondary | ICD-10-CM | POA: Insufficient documentation

## 2012-03-25 DIAGNOSIS — R269 Unspecified abnormalities of gait and mobility: Secondary | ICD-10-CM | POA: Insufficient documentation

## 2012-04-03 ENCOUNTER — Ambulatory Visit: Payer: Medicare Other | Admitting: Physical Therapy

## 2012-04-08 ENCOUNTER — Ambulatory Visit: Payer: Medicare Other | Admitting: Physical Therapy

## 2012-04-18 ENCOUNTER — Ambulatory Visit: Payer: Medicare Other

## 2012-04-22 ENCOUNTER — Ambulatory Visit: Payer: Medicare Other | Admitting: Physical Therapy

## 2012-04-25 ENCOUNTER — Ambulatory Visit: Payer: Medicare Other | Attending: Internal Medicine

## 2012-04-25 DIAGNOSIS — M6281 Muscle weakness (generalized): Secondary | ICD-10-CM | POA: Insufficient documentation

## 2012-04-25 DIAGNOSIS — R269 Unspecified abnormalities of gait and mobility: Secondary | ICD-10-CM | POA: Insufficient documentation

## 2012-04-25 DIAGNOSIS — IMO0001 Reserved for inherently not codable concepts without codable children: Secondary | ICD-10-CM | POA: Insufficient documentation

## 2012-04-29 ENCOUNTER — Ambulatory Visit: Payer: Medicare Other | Admitting: Physical Therapy

## 2012-05-02 ENCOUNTER — Ambulatory Visit: Payer: Medicare Other

## 2012-05-06 ENCOUNTER — Ambulatory Visit: Payer: Medicare Other | Admitting: Physical Therapy

## 2012-05-10 ENCOUNTER — Other Ambulatory Visit: Payer: Self-pay | Admitting: *Deleted

## 2012-05-10 MED ORDER — PREGABALIN 150 MG PO CAPS: 150.0000 mg | ORAL_CAPSULE | Freq: Three times a day (TID) | ORAL | Status: DC

## 2012-05-13 ENCOUNTER — Other Ambulatory Visit: Payer: Self-pay | Admitting: *Deleted

## 2012-05-13 ENCOUNTER — Ambulatory Visit: Payer: Medicare Other | Admitting: Physical Therapy

## 2012-05-13 DIAGNOSIS — G4700 Insomnia, unspecified: Secondary | ICD-10-CM

## 2012-05-13 MED ORDER — ZOLPIDEM TARTRATE 10 MG PO TABS: 10.0000 mg | ORAL_TABLET | Freq: Every evening | ORAL | Status: DC | PRN

## 2012-05-20 ENCOUNTER — Other Ambulatory Visit: Payer: Self-pay | Admitting: Gastroenterology

## 2012-05-20 ENCOUNTER — Telehealth: Payer: Self-pay | Admitting: Internal Medicine

## 2012-05-20 MED ORDER — DEXLANSOPRAZOLE 60 MG PO CPDR: 60.0000 mg | DELAYED_RELEASE_CAPSULE | Freq: Every day | ORAL | Status: DC

## 2012-05-20 NOTE — Telephone Encounter (Signed)
Is this ok?

## 2012-05-20 NOTE — Telephone Encounter (Signed)
Ok to prescribe-- one year

## 2012-05-20 NOTE — Telephone Encounter (Signed)
Pt called and said that she used to see Dr Jarold Motto and he used to prescribed dexlansoprazole (DEXILANT) 60 MG capsule. Pt no longer see Dr Jarold Motto and is req Dr Cato Mulligan to starts writing this rx for her and send to [facility].    Pls call pt when done.

## 2012-05-20 NOTE — Telephone Encounter (Signed)
Called patient and scheduled appointment for follow up on 06-21-2012..   Advised patient for further refills she must keep this appointment because her last office visit was 06-2010.  Patient verbalized understanding

## 2012-05-21 MED ORDER — DEXLANSOPRAZOLE 60 MG PO CPDR: 60.0000 mg | DELAYED_RELEASE_CAPSULE | Freq: Every day | ORAL | Status: DC

## 2012-05-21 NOTE — Telephone Encounter (Signed)
rx sent in electronically 

## 2012-05-22 ENCOUNTER — Ambulatory Visit: Payer: Medicare Other | Attending: Internal Medicine | Admitting: Physical Therapy

## 2012-05-22 DIAGNOSIS — IMO0001 Reserved for inherently not codable concepts without codable children: Secondary | ICD-10-CM | POA: Insufficient documentation

## 2012-05-22 DIAGNOSIS — R269 Unspecified abnormalities of gait and mobility: Secondary | ICD-10-CM | POA: Insufficient documentation

## 2012-05-22 DIAGNOSIS — M6281 Muscle weakness (generalized): Secondary | ICD-10-CM | POA: Insufficient documentation

## 2012-05-24 ENCOUNTER — Ambulatory Visit: Payer: Medicare Other

## 2012-05-27 ENCOUNTER — Ambulatory Visit: Payer: Medicare Other | Admitting: Physical Therapy

## 2012-05-29 ENCOUNTER — Ambulatory Visit: Payer: Medicare Other

## 2012-05-30 ENCOUNTER — Ambulatory Visit: Payer: Medicare Other

## 2012-06-03 ENCOUNTER — Ambulatory Visit: Payer: Medicare Other

## 2012-06-05 ENCOUNTER — Telehealth: Payer: Self-pay | Admitting: Internal Medicine

## 2012-06-05 NOTE — Telephone Encounter (Signed)
Pt was given a 90 day supply on 05/13/12.  Its to early for refill.  No answer, no machine to leave message

## 2012-06-05 NOTE — Telephone Encounter (Signed)
Pt needs new rx generic ambien 10 mg #90 [facility]

## 2012-06-06 ENCOUNTER — Ambulatory Visit: Payer: Medicare Other

## 2012-06-06 NOTE — Telephone Encounter (Signed)
Left message on machine -too earlty

## 2012-06-10 ENCOUNTER — Ambulatory Visit: Payer: Medicare Other | Admitting: Physical Therapy

## 2012-06-13 ENCOUNTER — Encounter: Payer: Self-pay | Admitting: *Deleted

## 2012-06-13 ENCOUNTER — Ambulatory Visit: Payer: Medicare Other | Admitting: Physical Therapy

## 2012-06-17 ENCOUNTER — Ambulatory Visit: Payer: Medicare Other | Admitting: Physical Therapy

## 2012-06-20 ENCOUNTER — Ambulatory Visit: Payer: Medicare Other | Attending: Internal Medicine

## 2012-06-20 DIAGNOSIS — IMO0001 Reserved for inherently not codable concepts without codable children: Secondary | ICD-10-CM | POA: Insufficient documentation

## 2012-06-20 DIAGNOSIS — M6281 Muscle weakness (generalized): Secondary | ICD-10-CM | POA: Insufficient documentation

## 2012-06-20 DIAGNOSIS — R269 Unspecified abnormalities of gait and mobility: Secondary | ICD-10-CM | POA: Insufficient documentation

## 2012-06-21 ENCOUNTER — Encounter: Payer: Self-pay | Admitting: Gastroenterology

## 2012-06-21 ENCOUNTER — Ambulatory Visit (INDEPENDENT_AMBULATORY_CARE_PROVIDER_SITE_OTHER): Payer: Medicare Other | Admitting: Gastroenterology

## 2012-06-21 VITALS — BP 112/74 | HR 77 | Ht 62.0 in | Wt 147.8 lb

## 2012-06-21 DIAGNOSIS — R599 Enlarged lymph nodes, unspecified: Secondary | ICD-10-CM

## 2012-06-21 DIAGNOSIS — R590 Localized enlarged lymph nodes: Secondary | ICD-10-CM

## 2012-06-21 DIAGNOSIS — Z800 Family history of malignant neoplasm of digestive organs: Secondary | ICD-10-CM

## 2012-06-21 DIAGNOSIS — Z9889 Other specified postprocedural states: Secondary | ICD-10-CM

## 2012-06-21 DIAGNOSIS — K5730 Diverticulosis of large intestine without perforation or abscess without bleeding: Secondary | ICD-10-CM

## 2012-06-21 DIAGNOSIS — Z8719 Personal history of other diseases of the digestive system: Secondary | ICD-10-CM

## 2012-06-21 DIAGNOSIS — M542 Cervicalgia: Secondary | ICD-10-CM

## 2012-06-21 MED ORDER — SUCRALFATE 1 GM/10ML PO SUSP: 1.0000 g | ORAL | Status: DC | PRN

## 2012-06-21 MED ORDER — METOCLOPRAMIDE HCL 10 MG PO TABS: ORAL_TABLET | ORAL | Status: AC

## 2012-06-21 MED ORDER — DEXLANSOPRAZOLE 60 MG PO CPDR: 60.0000 mg | DELAYED_RELEASE_CAPSULE | Freq: Every day | ORAL | Status: DC

## 2012-06-21 NOTE — Patient Instructions (Addendum)
You have been scheduled for an Thyroid/Neck Ultrasound at [facility] (1st floor of hospital) on 06-25-2012 at 9 AM. Please arrive 15 minutes prior to your appointment for registration. Make certain not to have anything to eat or drink 6 hours prior to your appointment. Should you need to reschedule your appointment, please contact radiology at [phone]. This test typically takes about 30 minutes to perform.  We have sent the following medications to your pharmacy for you to pick up at your convenience: Dexilant, Carafate, and Reglan.  Your physician has requested that you go to the basement for the following lab work before leaving today: IFOB

## 2012-06-21 NOTE — Progress Notes (Signed)
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  She currently is on daily Dexilant 60 mg a day and denies upper GI or hepatobiliary complaints.  She also has regular bowel movements without melena or hematochezia.  Previous colonoscopies have shown diverticulosis but no evidence of colon polyps.  Last colonoscopy was in March of 2009.  Since she was last seen the patient has had multiple orthopedic operations, prolonged antibiotic therapy for atypical mycobacteria infection of her right foot, and accelerating depression.  Her main complaint today is of swelling in her right neck for several months.  Review of recent labs by Dr. Cato Mulligan shows no specific abnormalities or evidence of anemia or elevated liver enzymes.   I have reviewed this patient's present history, medical and surgical past history, allergies and medications.     ROS: The remainder of the 10 point ROS is negative... 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  She does use when necessary Carafate for indigestion.  Her appetite is good and her weight is stable.     Physical Exam: Blood pressure 112/74, pulse 77 and regular, and weight 147 with a BMI of 27.03. General well developed well nourished patient in no acute distress, appearing their stated age Eyes PERRLA, no icterus, fundoscopic exam per opthamologist Skin no lesions noted Neck supple, no adenopathy, no thyroid enlargement, no tenderness Chest clear to percussion and auscultation Heart no significant murmurs, gallops or rubs noted Abdomen no hepatosplenomegaly masses or tenderness, BS normal.  Neck: There is a freely mobile 0.5-1 cm lymph node in the right anterior cervical area.  I cannot appreciate thyromegaly or other neck masses. Psychological mental status normal and normal affect.  Assessment and plan:  Chronic GERD doing well on daily PPI therapy which I have renewed.  Her GI complaints are managed by  high-fiber diet, and she uses when necessary Carafate, when necessary Reglan for gas and bloating.  Because of her psychological difficulties, she does not want colonoscopy at this time.  I have asked her to do IFOB stool cards, and at her request we will do thyroid and neck ultrasound exam.  Her father apparently was age 46 when he had colon cancer.  She currently is on Prozac 20 mg a day, Wellbutrin 150 mg a day, and is undergoing outpatient rehabilitation for her orthopedic gas neurological problems.  It is unclear to me if she is under psychiatric care.  If her stool cards are negative, she will see me again after one year and we will again review her need for colonoscopy and/or endoscopy. No diagnosis found.

## 2012-06-24 ENCOUNTER — Ambulatory Visit: Payer: Medicare Other | Admitting: Physical Therapy

## 2012-06-24 ENCOUNTER — Telehealth: Payer: Self-pay | Admitting: Internal Medicine

## 2012-06-24 DIAGNOSIS — E041 Nontoxic single thyroid nodule: Secondary | ICD-10-CM | POA: Insufficient documentation

## 2012-06-24 DIAGNOSIS — G4700 Insomnia, unspecified: Secondary | ICD-10-CM

## 2012-06-24 NOTE — Telephone Encounter (Signed)
Dr. Cato Mulligan, a 90 day refill was given to her on 05/13/12.  Pt walked in on 06/06/12 saying she needed more and you wrote another rx for her.

## 2012-06-24 NOTE — Telephone Encounter (Signed)
Call and see if you can straighten this out.Marland KitchenMarland Kitchen

## 2012-06-24 NOTE — Telephone Encounter (Signed)
Patient called stating that she need a 90 day refill of zolpidem 10mg  1po at bedtime prn [facility] and also have some sent to [facility] until the mail order is received. Please assist.

## 2012-06-25 ENCOUNTER — Ambulatory Visit (HOSPITAL_COMMUNITY)
Admission: RE | Admit: 2012-06-25 | Discharge: 2012-06-25 | Disposition: A | Discharge status: Home / Self Care | Payer: Medicare Other | Source: Ambulatory Visit | Attending: Gastroenterology | Admitting: Gastroenterology

## 2012-06-25 DIAGNOSIS — E042 Nontoxic multinodular goiter: Secondary | ICD-10-CM | POA: Insufficient documentation

## 2012-06-25 DIAGNOSIS — M542 Cervicalgia: Secondary | ICD-10-CM | POA: Insufficient documentation

## 2012-06-25 NOTE — Telephone Encounter (Signed)
She needs one to [facility] Can give 30 day supply for local pharmacy

## 2012-06-25 NOTE — Telephone Encounter (Signed)
[facility] and pt had #30 filled on 12/20.  No other refills since 2011.  Called [facility] and they have on file #90 on 03/11/12 with no refills

## 2012-06-26 MED ORDER — ZOLPIDEM TARTRATE 10 MG PO TABS: 10.0000 mg | ORAL_TABLET | Freq: Every evening | ORAL | Status: DC | PRN

## 2012-06-26 NOTE — Telephone Encounter (Signed)
rx's faxed to both pharmacys

## 2012-06-27 ENCOUNTER — Ambulatory Visit: Payer: Medicare Other

## 2012-07-09 ENCOUNTER — Other Ambulatory Visit: Payer: Self-pay | Admitting: *Deleted

## 2012-07-09 MED ORDER — BUPROPION HCL ER (SR) 150 MG PO TB12: 150.0000 mg | ORAL_TABLET | Freq: Every day | ORAL | Status: DC

## 2012-07-09 MED ORDER — ESTROGENS CONJUGATED 0.625 MG PO TABS: 0.6250 mg | ORAL_TABLET | Freq: Every day | ORAL | Status: DC

## 2012-07-09 MED ORDER — FLUOXETINE HCL 20 MG PO CAPS: 20.0000 mg | ORAL_CAPSULE | Freq: Every day | ORAL | Status: DC

## 2012-07-09 MED ORDER — AMLODIPINE BESYLATE 5 MG PO TABS: 5.0000 mg | ORAL_TABLET | Freq: Every day | ORAL | Status: DC

## 2012-07-22 ENCOUNTER — Other Ambulatory Visit (INDEPENDENT_AMBULATORY_CARE_PROVIDER_SITE_OTHER): Payer: Medicare Other

## 2012-07-22 DIAGNOSIS — Z800 Family history of malignant neoplasm of digestive organs: Secondary | ICD-10-CM

## 2012-07-22 DIAGNOSIS — K5730 Diverticulosis of large intestine without perforation or abscess without bleeding: Secondary | ICD-10-CM

## 2012-07-22 LAB — FECAL OCCULT BLOOD, IMMUNOCHEMICAL: Fecal Occult Bld: NEGATIVE

## 2012-08-03 ENCOUNTER — Other Ambulatory Visit: Payer: Self-pay

## 2012-08-05 ENCOUNTER — Other Ambulatory Visit: Payer: Self-pay | Admitting: *Deleted

## 2012-08-05 MED ORDER — PREGABALIN 150 MG PO CAPS: 150.0000 mg | ORAL_CAPSULE | Freq: Three times a day (TID) | ORAL | Status: DC

## 2012-08-27 ENCOUNTER — Telehealth: Payer: Self-pay | Admitting: Internal Medicine

## 2012-08-27 DIAGNOSIS — R269 Unspecified abnormalities of gait and mobility: Secondary | ICD-10-CM

## 2012-08-27 NOTE — Telephone Encounter (Signed)
Pt id requesting an order for more PT. Pt just recently completed her PT was out patient pt with [facility]. Please contact pt

## 2012-09-04 NOTE — Telephone Encounter (Signed)
Pt needs referral for her balance.  Ok per Dr Cato Mulligan, referral order placed

## 2012-09-11 ENCOUNTER — Ambulatory Visit: Payer: Medicare Other

## 2012-10-21 ENCOUNTER — Other Ambulatory Visit: Payer: Self-pay | Admitting: *Deleted

## 2012-10-21 MED ORDER — FLUTICASONE-SALMETEROL 250-50 MCG/DOSE IN AEPB: 1.0000 | INHALATION_SPRAY | Freq: Every day | RESPIRATORY_TRACT | Status: DC

## 2012-10-21 MED ORDER — PREGABALIN 150 MG PO CAPS: 150.0000 mg | ORAL_CAPSULE | Freq: Three times a day (TID) | ORAL | Status: DC

## 2012-11-04 ENCOUNTER — Other Ambulatory Visit: Payer: Self-pay | Admitting: *Deleted

## 2012-11-04 DIAGNOSIS — G4700 Insomnia, unspecified: Secondary | ICD-10-CM

## 2012-11-04 MED ORDER — ZOLPIDEM TARTRATE 10 MG PO TABS: 10.0000 mg | ORAL_TABLET | Freq: Every evening | ORAL | Status: DC | PRN

## 2012-11-06 ENCOUNTER — Telehealth: Payer: Self-pay | Admitting: Internal Medicine

## 2012-11-06 DIAGNOSIS — N8110 Cystocele, unspecified: Secondary | ICD-10-CM

## 2012-11-06 NOTE — Telephone Encounter (Signed)
Refer urology les borden Brett Canales [place] Jonny Ruiz wrenn

## 2012-11-06 NOTE — Telephone Encounter (Signed)
Patient Information:  Caller Name: Darel Hong  Phone: [phone]  Patient: Christina Hoover  Gender: Female  DOB: 1935/07/27  Age: 77 Years  PCP: Birdie Sons (Adults only)  Office Follow Up:  Does the office need to follow up with this patient?: Yes  Instructions For The Office: Please call pt with referral to Urologist.  RN Note:  Please call pt with a Urololgy Referral. Use number above first. If unavailable my try cell [phone] contact.  Symptoms  Reason For Call & Symptoms: Need referal for prolapsed bladder. No other sx at the moment. Denies any UTI sx. Triage offered and declned. Pt only wants referral for Urologist-pt "trusts Dr. Cato Mulligan referral". Pt's GYN is aware of this condition-seen 3 months ago for annual visit with GYN.  Reviewed Health History In EMR: Yes  Reviewed Medications In EMR: Yes  Reviewed Allergies In EMR: Yes  Reviewed Surgeries / Procedures: Yes  Date of Onset of Symptoms: 02/28/2012  Treatments Tried: Pushing bladder  back inside if protruding.  Treatments Tried Worked: No  Guideline(s) Used:  No Protocol Available - Information Only  Disposition Per Guideline:   Discuss with PCP and Callback by Nurse Today  Reason For Disposition Reached:   Nursing judgment  Advice Given:  N/A  Patient Will Follow Care Advice:  YES

## 2012-11-07 NOTE — Telephone Encounter (Signed)
Referral order placed.

## 2012-11-29 ENCOUNTER — Other Ambulatory Visit: Payer: Self-pay | Admitting: *Deleted

## 2012-11-29 MED ORDER — DIAZEPAM 5 MG PO TABS: ORAL_TABLET | ORAL | Status: DC

## 2013-01-03 ENCOUNTER — Other Ambulatory Visit: Payer: Self-pay | Admitting: *Deleted

## 2013-01-03 MED ORDER — IBANDRONATE SODIUM 150 MG PO TABS: 150.0000 mg | ORAL_TABLET | ORAL | Status: DC

## 2013-03-10 ENCOUNTER — Other Ambulatory Visit: Payer: Self-pay | Admitting: *Deleted

## 2013-03-10 MED ORDER — FLUTICASONE-SALMETEROL 250-50 MCG/DOSE IN AEPB: 1.0000 | INHALATION_SPRAY | Freq: Every day | RESPIRATORY_TRACT | Status: DC

## 2013-03-17 ENCOUNTER — Telehealth: Payer: Self-pay | Admitting: Internal Medicine

## 2013-03-17 NOTE — Telephone Encounter (Signed)
Pt would like a zpak for her sinus issue ( B/c it will go into her bronchial tubes if she doesn't get something.) Pt is out of town and cannot get in for appt. Pt also needs refill of pregabalin (LYRICA) 150 MG capsule Pt is out of meds and needs some sent locally. pls send  7 days  Of lyrica to [facility] [phone] pls send [facility] days [facility] Pt has made the needed appt for that refill of lyrica for 11/21.

## 2013-03-18 MED ORDER — AZITHROMYCIN 250 MG PO TABS: ORAL_TABLET | ORAL | Status: DC

## 2013-03-18 MED ORDER — PREGABALIN 150 MG PO CAPS: 150.0000 mg | ORAL_CAPSULE | Freq: Three times a day (TID) | ORAL | Status: DC

## 2013-03-18 NOTE — Telephone Encounter (Signed)
Ok to all zpack-   lyrica- ok to refill

## 2013-03-18 NOTE — Telephone Encounter (Signed)
rx called in, pt aware 

## 2013-03-21 ENCOUNTER — Telehealth: Payer: Self-pay | Admitting: Internal Medicine

## 2013-03-21 DIAGNOSIS — G4700 Insomnia, unspecified: Secondary | ICD-10-CM

## 2013-03-21 MED ORDER — ZOLPIDEM TARTRATE 10 MG PO TABS: 10.0000 mg | ORAL_TABLET | Freq: Every evening | ORAL | Status: DC | PRN

## 2013-03-21 NOTE — Telephone Encounter (Signed)
rx faxed to pharmacy

## 2013-03-21 NOTE — Telephone Encounter (Signed)
Pt is calling to request that a new RX of zolpidem (AMBIEN) 10 MG tablet be called into [facility]. Please assist.

## 2013-03-24 ENCOUNTER — Telehealth: Payer: Self-pay | Admitting: Internal Medicine

## 2013-03-24 NOTE — Telephone Encounter (Signed)
Pt needs 7 day supply of lyrica 150 mg call [facility]. Pt is waiting on mailorder

## 2013-03-24 NOTE — Telephone Encounter (Signed)
7 day supply was called in to [facility] in [place], [place] on Friday and pt never picked it up.  Called and canceled rx there and called in new 7 day rx to [facility]

## 2013-03-25 ENCOUNTER — Ambulatory Visit (INDEPENDENT_AMBULATORY_CARE_PROVIDER_SITE_OTHER): Payer: Medicare Other | Admitting: Family

## 2013-03-25 ENCOUNTER — Encounter: Payer: Self-pay | Admitting: Family

## 2013-03-25 VITALS — BP 120/60 | HR 69 | Wt 140.0 lb

## 2013-03-25 DIAGNOSIS — R110 Nausea: Secondary | ICD-10-CM

## 2013-03-25 DIAGNOSIS — F329 Major depressive disorder, single episode, unspecified: Secondary | ICD-10-CM

## 2013-03-25 LAB — BASIC METABOLIC PANEL
BUN: 8 mg/dL (ref 6–23)
CO2: 28 mEq/L (ref 19–32)
Calcium: 8.9 mg/dL (ref 8.4–10.5)
Chloride: 107 mEq/L (ref 96–112)
Creatinine, Ser: 0.8 mg/dL (ref 0.4–1.2)
GFR: 71.17 mL/min (ref >60.00)

## 2013-03-25 LAB — CBC WITH DIFFERENTIAL/PLATELET
Basophils Absolute: 0 10*3/uL (ref 0.0–0.1)
Eosinophils Absolute: 0.5 10*3/uL (ref 0.0–0.7)
Hemoglobin: 15 g/dL (ref 12.0–15.0)
Lymphocytes Relative: 20.1 % (ref 12.0–46.0)
MCHC: 33.8 g/dL (ref 30.0–36.0)
MCV: 90.1 fl (ref 78.0–100.0)
Monocytes Absolute: 0.5 10*3/uL (ref 0.1–1.0)
Neutro Abs: 5.5 10*3/uL (ref 1.4–7.7)
Neutrophils Relative %: 67.1 % (ref 43.0–77.0)
Platelets: 265 10*3/uL (ref 150.0–400.0)
RBC: 4.93 Mil/uL (ref 3.87–5.11)

## 2013-03-25 LAB — HEPATIC FUNCTION PANEL
Bilirubin, Direct: 0.1 mg/dL (ref 0.0–0.3)
Total Bilirubin: 0.6 mg/dL (ref 0.3–1.2)
Total Protein: 6.9 g/dL (ref 6.0–8.3)

## 2013-03-25 LAB — POCT URINALYSIS DIPSTICK
Bilirubin, UA: NEGATIVE
Glucose, UA: NEGATIVE
Ketones, UA: NEGATIVE
Nitrite, UA: NEGATIVE
Spec Grav, UA: 1.01
Urobilinogen, UA: 0.2

## 2013-03-25 NOTE — Patient Instructions (Addendum)
Stress Stress-related medical problems are becoming increasingly common. The body has a built-in physical response to stressful situations. Faced with pressure, challenge or danger, we need to react quickly. Our bodies release hormones such as cortisol and adrenaline to help do this. These hormones are part of the "fight or flight" response and affect the metabolic rate, heart rate and blood pressure, resulting in a heightened, stressed state that prepares the body for optimum performance in dealing with a stressful situation. It is likely that early man required these mechanisms to stay alive, but usually modern stresses do not call for this, and the same hormones released in today's world can damage health and reduce coping ability. CAUSES  Pressure to perform at work, at school or in sports.  Threats of physical violence.  Money worries.  Arguments.  Family conflicts.  Divorce or separation from significant other.  Bereavement.  New job or unemployment.  Changes in location.  Alcohol or drug abuse. SOMETIMES, THERE IS NO PARTICULAR REASON FOR DEVELOPING STRESS. Almost all people are at risk of being stressed at some time in their lives. It is important to know that some stress is temporary and some is long term.  Temporary stress will go away when a situation is resolved. Most people can cope with short periods of stress, and it can often be relieved by relaxing, taking a walk, chatting through issues with friends, or having a good night's sleep.  Chronic (long-term, continuous) stress is much harder to deal with. It can be psychologically and emotionally damaging. It can be harmful both for an individual and for friends and family. SYMPTOMS Everyone reacts to stress differently. There are some common effects that help us recognize it. In times of extreme stress, people may:  Shake uncontrollably.  Breathe faster and deeper than normal (hyperventilate).  Vomit.  For people  with asthma, stress can trigger an attack.  For some people, stress may trigger migraine headaches, ulcers, and body pain. PHYSICAL EFFECTS OF STRESS MAY INCLUDE:  Loss of energy.  Skin problems.  Aches and pains resulting from tense muscles, including neck ache, backache and tension headaches.  Increased pain from arthritis and other conditions.  Irregular heart beat (palpitations).  Periods of irritability or anger.  Apathy or depression.  Anxiety (feeling uptight or worrying).  Unusual behavior.  Loss of appetite.  Comfort eating.  Lack of concentration.  Loss of, or decreased, sex-drive.  Increased smoking, drinking, or recreational drug use.  For women, missed periods.  Ulcers, joint pain, and muscle pain. Post-traumatic stress is the stress caused by any serious accident, strong emotional damage, or extremely difficult or violent experience such as rape or war. Post-traumatic stress victims can experience mixtures of emotions such as fear, shame, depression, guilt or anger. It may include recurrent memories or images that may be haunting. These feelings can last for weeks, months or even years after the traumatic event that triggered them. Specialized treatment, possibly with medicines and psychological therapies, is available. If stress is causing physical symptoms, severe distress or making it difficult for you to function as normal, it is worth seeing your caregiver. It is important to remember that although stress is a usual part of life, extreme or prolonged stress can lead to other illnesses that will need treatment. It is better to visit a doctor sooner rather than later. Stress has been linked to the development of high blood pressure and heart disease, as well as insomnia and depression. There is no diagnostic test for   stress since everyone reacts to it differently. But a caregiver will be able to spot the physical symptoms, such  as:  Headaches.  Shingles.  Ulcers. Emotional distress such as intense worry, low mood or irritability should be detected when the doctor asks pertinent questions to identify any underlying problems that might be the cause. In case there are physical reasons for the symptoms, the doctor may also want to do some tests to exclude certain conditions. If you feel that you are suffering from stress, try to identify the aspects of your life that are causing it. Sometimes you may not be able to change or avoid them, but even a small change can have a positive ripple effect. A simple lifestyle change can make all the difference. STRATEGIES THAT CAN HELP DEAL WITH STRESS:  Delegating or sharing responsibilities.  Avoiding confrontations.  Learning to be more assertive.  Regular exercise.  Avoid using alcohol or street drugs to cope.  Eating a healthy, balanced diet, rich in fruit and vegetables and proteins.  Finding humor or absurdity in stressful situations.  Never taking on more than you know you can handle comfortably.  Organizing your time better to get as much done as possible.  Talking to friends or family and sharing your thoughts and fears.  Listening to music or relaxation tapes.  Tensing and then relaxing your muscles, starting at the toes and working up to the head and neck. If you think that you would benefit from help, either in identifying the things that are causing your stress or in learning techniques to help you relax, see a caregiver who is capable of helping you with this. Rather than relying on medications, it is usually better to try and identify the things in your life that are causing stress and try to deal with them. There are many techniques of managing stress including counseling, psychotherapy, aromatherapy, yoga, and exercise. Your caregiver can help you determine what is best for you. Document Released: 08/26/2002 Document Revised: 08/28/2011 Document  Reviewed: 07/23/2007 ExitCare Patient Information 2014 [employer].  

## 2013-03-25 NOTE — Progress Notes (Signed)
Subjective:    Patient ID: Christina Hoover, female    DOB: 1936-04-25, 77 y.o.   MRN: [id number]  HPI 77 year old white female, nonsmoker is in today for complaints of nausea, epigastric pain that appears to be improving today. Reports she's felt unsteady on her feet at times has noticed body jerks over the last couple days. Reports she ran out of her Lyrica x10 days. She has been on the medication on many years. Reports being a worrier at times. Is afraid that she can have a stroke one day because her father had a stroke. She is requesting a chest x-ray today but is not quite sure why she wants the chest x-ray. She seen GI in the past, 2001 and had an endoscopy done.   Review of Systems  Constitutional: Negative.   Respiratory: Negative.   Cardiovascular: Negative.   Gastrointestinal: Positive for nausea and abdominal pain. Negative for diarrhea, constipation and blood in stool.  Endocrine: Negative.   Genitourinary: Negative.   Musculoskeletal: Negative.   Skin: Negative.   Allergic/Immunologic: Negative.   Neurological: Negative.   Hematological: Negative.   Psychiatric/Behavioral: Negative for sleep disturbance and self-injury. The patient is nervous/anxious.    Past Medical History  Diagnosis Date  . GERD (gastroesophageal reflux disease)   . History of colonic polyps 08-19-2007    HYPERPLASTIC POLYP  . Atypical mycobacterial infection   . Hypertension   . Myalgia and myositis, unspecified   . Hyperlipidemia   . Headache(784.0)   . Depression   . Asthma   . Anxiety   . Hiatal hernia 08-19-2007    EGD  . Diverticulosis of colon (without mention of hemorrhage) 08-19-2007    Colonoscopy     History   Social History  . Marital Status: Married    Spouse Name: N/A    Number of Children: 2  . Years of Education: N/A   Occupational History  . Retired    Social History Main Topics  . Smoking status: Never Smoker   . Smokeless tobacco: Never Used  . Alcohol Use: No  .  Drug Use: No  . Sexual Activity: Not on file   Other Topics Concern  . Not on file   Social History Narrative  . No narrative on file    Past Surgical History  Procedure Laterality Date  . Total abdominal hysterectomy w/ bilateral salpingoophorectomy    . Knee surgery      Left  . Breast surgery    . Toe spur      right toe spur removed  . Appendectomy    . Replacement total knee      Right 2009/Left   . Cataract extraction, bilateral    . Back surgery  12/2010    [place]    Family History  Problem Relation Age of Onset  . Hypertension Mother   . Heart failure Mother   . Stroke Father 29  . Hypertension Sister   . Hypertension Brother   . Breast cancer Maternal Aunt   . Breast cancer Paternal Aunt     Allergies  Allergen Reactions  . Lisinopril     REACTION: hacky cough    Current Outpatient Prescriptions on File Prior to Visit  Medication Sig Dispense Refill  . amLODipine (NORVASC) 5 MG tablet Take 1 tablet (5 mg total) by mouth daily.  90 tablet  3  . Ascorbic Acid (VITAMIN C) 500 MG tablet Take 500 mg by mouth daily.        Marland Kitchen  azithromycin (ZITHROMAX) 250 MG tablet Use as directed  6 tablet  0  . B Complex Vitamins (B COMPLEX 100) tablet Take 1 tablet by mouth daily.        Marland Kitchen buPROPion (WELLBUTRIN SR) 150 MG 12 hr tablet Take 1 tablet (150 mg total) by mouth daily.  90 tablet  3  . dexlansoprazole (DEXILANT) 60 MG capsule Take 1 capsule (60 mg total) by mouth daily before breakfast.  90 capsule  3  . diazepam (VALIUM) 5 MG tablet TAKE 1 TABLET AT BEDTIME IF NEEDED FOR PAIN.  30 tablet  0  . estrogens, conjugated, (PREMARIN) 0.625 MG tablet Take 1 tablet (0.625 mg total) by mouth daily.  90 tablet  3  . fish oil-omega-3 fatty acids 1000 MG capsule Take 2 g by mouth daily.      Marland Kitchen FLUoxetine (PROZAC) 20 MG capsule Take 1 capsule (20 mg total) by mouth daily.  90 capsule  1  . Fluticasone-Salmeterol (ADVAIR DISKUS) 250-50 MCG/DOSE AEPB Inhale 1 puff into the  lungs daily. 1 puff daily  180 each  1  . ibandronate (BONIVA) 150 MG tablet Take 1 tablet (150 mg total) by mouth as directed. Take in the morning with a full glass of water, on an empty stomach, and do not take anything else by mouth or lie down for the next 30 min.  1 tablet  0  . metoCLOPramide (REGLAN) 10 MG tablet Takes 1/2 to 1 tablet by mouth daily as needed for bloating  90 tablet  0  . pregabalin (LYRICA) 150 MG capsule Take 1 capsule (150 mg total) by mouth 3 (three) times daily.  270 capsule  0  . sucralfate (CARAFATE) 1 GM/10ML suspension Take 10 mLs (1 g total) by mouth as needed.  420 mL  3  . zolpidem (AMBIEN) 10 MG tablet Take 1 tablet (10 mg total) by mouth at bedtime as needed. NEEDS OV  90 tablet  0   No current facility-administered medications on file prior to visit.    BP 120/60  Pulse 69  Wt 140 lb (63.504 kg)  BMI 25.6 kg/m2chart    Objective:   Physical Exam  Constitutional: She is oriented to person, place, and time. She appears well-developed and well-nourished.  HENT:  Right Ear: External ear normal.  Left Ear: External ear normal.  Nose: Nose normal.  Mouth/Throat: Oropharynx is clear and moist.  Neck: Normal range of motion. Neck supple.  Cardiovascular: Normal rate, regular rhythm and normal heart sounds.   Pulmonary/Chest: Effort normal and breath sounds normal.  Abdominal: Soft. Bowel sounds are normal. She exhibits no distension. There is no tenderness. There is no rebound.  Musculoskeletal: Normal range of motion.  Neurological: She is alert and oriented to person, place, and time.  Skin: Skin is warm and dry.  Psychiatric: She has a normal mood and affect.          Assessment & Plan:  Assessment: 1. Nausea 2. Stress reaction 3. Hiatal hernia 4. Medication withdrawal  Plan: The LAD stent. We'll follow up pending results. Continue Lyrica. I feel like her symptoms are largely related to Lyrica withdrawal. Consider referral to GI for  endoscopy for evaluation of epigastric pain and previous hiatal hernia.

## 2013-04-24 ENCOUNTER — Other Ambulatory Visit: Payer: Self-pay

## 2013-05-09 ENCOUNTER — Ambulatory Visit (INDEPENDENT_AMBULATORY_CARE_PROVIDER_SITE_OTHER): Payer: Medicare Other | Admitting: Internal Medicine

## 2013-05-09 ENCOUNTER — Encounter: Payer: Self-pay | Admitting: Internal Medicine

## 2013-05-09 VITALS — BP 122/64 | HR 80 | Temp 98.0°F | Ht 62.0 in | Wt 147.0 lb

## 2013-05-09 DIAGNOSIS — M810 Age-related osteoporosis without current pathological fracture: Secondary | ICD-10-CM

## 2013-05-09 DIAGNOSIS — E785 Hyperlipidemia, unspecified: Secondary | ICD-10-CM

## 2013-05-09 DIAGNOSIS — IMO0001 Reserved for inherently not codable concepts without codable children: Secondary | ICD-10-CM

## 2013-05-09 DIAGNOSIS — I10 Essential (primary) hypertension: Secondary | ICD-10-CM

## 2013-05-09 DIAGNOSIS — J45909 Unspecified asthma, uncomplicated: Secondary | ICD-10-CM

## 2013-05-09 NOTE — Progress Notes (Signed)
I have not seen in quite some time- she is also seen at [facility].  Fibromyalgia-- she uses occasional hydrocodone and diazepam.  (maybe one pill per week- "at the most")  HTN- no home BPs  GERD- no sxs on current meds  Lipids- no meds  Sleep- uses ambien every night ("i sleep good)  reviwed pmh, psh, meds, shx  Ros- denies any significant sxs but admits that she is "slowing down"   Exam- reviewed vitals  Well-developed well-nourished female in no acute distress. HEENT exam atraumatic, normocephalic, extraocular muscles are intact. Neck is supple. No jugular venous distention no thyromegaly. Chest clear to auscultation without increased work of breathing. Cardiac exam S1 and S2 are regular. Abdominal exam active bowel sounds, soft, nontender. Extremities no edema. Neurologic exam she is alert without any motor sensory deficits. Gait is normal.

## 2013-05-09 NOTE — Assessment & Plan Note (Signed)
No treatment- reasonable to recheck

## 2013-05-09 NOTE — Assessment & Plan Note (Signed)
She does reasonably well On lyrica Dependent on high dose ambien for sleep  RARE use of hydrocodone and diazepam

## 2013-05-09 NOTE — Assessment & Plan Note (Signed)
No significant sxs- continue inhaler

## 2013-05-09 NOTE — Assessment & Plan Note (Signed)
Well controlled continue meds 

## 2013-05-09 NOTE — Progress Notes (Signed)
Pre visit review using our clinic review tool, if applicable. No additional management support is needed unless otherwise documented below in the visit note. 

## 2013-05-12 ENCOUNTER — Telehealth: Payer: Self-pay | Admitting: Internal Medicine

## 2013-05-12 MED ORDER — PREGABALIN 150 MG PO CAPS: 150.0000 mg | ORAL_CAPSULE | Freq: Three times a day (TID) | ORAL | Status: DC

## 2013-05-12 NOTE — Telephone Encounter (Signed)
Pt needs new rx lyrica send to [facility]

## 2013-05-12 NOTE — Telephone Encounter (Signed)
rx faxed to pharmacy

## 2013-05-21 ENCOUNTER — Ambulatory Visit (INDEPENDENT_AMBULATORY_CARE_PROVIDER_SITE_OTHER)
Admission: RE | Admit: 2013-05-21 | Discharge: 2013-05-21 | Disposition: A | Discharge status: Home / Self Care | Payer: Medicare Other | Source: Ambulatory Visit | Attending: Internal Medicine | Admitting: Internal Medicine

## 2013-05-21 DIAGNOSIS — M810 Age-related osteoporosis without current pathological fracture: Secondary | ICD-10-CM

## 2013-05-27 ENCOUNTER — Other Ambulatory Visit: Payer: Self-pay | Admitting: *Deleted

## 2013-05-27 MED ORDER — DIAZEPAM 5 MG PO TABS: ORAL_TABLET | ORAL | Status: DC

## 2013-05-30 ENCOUNTER — Other Ambulatory Visit: Payer: Self-pay | Admitting: *Deleted

## 2013-05-30 MED ORDER — HYDROCODONE-ACETAMINOPHEN 5-325 MG PO TABS: 1.0000 | ORAL_TABLET | Freq: Every day | ORAL | Status: DC | PRN

## 2013-05-30 NOTE — Telephone Encounter (Signed)
rx ready for p/u, pt aware

## 2013-06-23 ENCOUNTER — Telehealth: Payer: Self-pay | Admitting: Internal Medicine

## 2013-06-23 DIAGNOSIS — G4700 Insomnia, unspecified: Secondary | ICD-10-CM

## 2013-06-23 MED ORDER — ZOLPIDEM TARTRATE 10 MG PO TABS: 10.0000 mg | ORAL_TABLET | Freq: Every evening | ORAL | Status: DC | PRN

## 2013-06-23 NOTE — Telephone Encounter (Signed)
Patient called requesting a refill on zolpidem (AMBIEN) 10 MG tablet Please advise

## 2013-06-23 NOTE — Telephone Encounter (Signed)
Ok per Dr Swords, rx faxed to [facility] 

## 2013-07-23 DIAGNOSIS — H04129 Dry eye syndrome of unspecified lacrimal gland: Secondary | ICD-10-CM | POA: Insufficient documentation

## 2013-08-12 ENCOUNTER — Other Ambulatory Visit: Payer: Self-pay | Admitting: *Deleted

## 2013-08-12 MED ORDER — DEXLANSOPRAZOLE 60 MG PO CPDR: 60.0000 mg | DELAYED_RELEASE_CAPSULE | Freq: Every day | ORAL | Status: DC

## 2013-08-13 ENCOUNTER — Telehealth: Payer: Self-pay | Admitting: Internal Medicine

## 2013-08-13 MED ORDER — AMLODIPINE BESYLATE 5 MG PO TABS: 5.0000 mg | ORAL_TABLET | Freq: Every day | ORAL | Status: DC

## 2013-08-13 NOTE — Telephone Encounter (Signed)
[facility] requesting refill of amLODipine (NORVASC) 5 MG tablet

## 2013-08-13 NOTE — Telephone Encounter (Signed)
rx sent in electronically 

## 2013-08-14 ENCOUNTER — Telehealth: Payer: Self-pay | Admitting: Internal Medicine

## 2013-08-14 MED ORDER — FLUOXETINE HCL 20 MG PO CAPS: 20.0000 mg | ORAL_CAPSULE | Freq: Every day | ORAL | Status: DC

## 2013-08-14 MED ORDER — ESTROGENS CONJUGATED 0.625 MG PO TABS: 0.6250 mg | ORAL_TABLET | Freq: Every day | ORAL | Status: DC

## 2013-08-14 MED ORDER — BUPROPION HCL ER (SR) 150 MG PO TB12: 150.0000 mg | ORAL_TABLET | Freq: Every day | ORAL | Status: DC

## 2013-08-14 NOTE — Telephone Encounter (Signed)
[facility] ([facility]) requesting refills of the following:  buPROPion (WELLBUTRIN SR) 150 MG 12 hr tablet estrogens, conjugated, (PREMARIN) 0.625 MG tablet FLUoxetine (PROZAC) 20 MG capsule

## 2013-08-14 NOTE — Telephone Encounter (Signed)
Rx sent to [facility].   

## 2013-09-11 ENCOUNTER — Telehealth: Payer: Self-pay | Admitting: Internal Medicine

## 2013-09-11 NOTE — Telephone Encounter (Signed)
Pt request rx HYDROcodone-acetaminophen (NORCO/VICODIN) 5-325 MG per tablet Pt going out of town this weekend and would like to pu on friday am, 3/27.

## 2013-09-12 MED ORDER — HYDROCODONE-ACETAMINOPHEN 5-325 MG PO TABS: 1.0000 | ORAL_TABLET | Freq: Every day | ORAL | Status: DC | PRN

## 2013-09-12 NOTE — Telephone Encounter (Signed)
Ok per Dr Swords, rx up front for p/u, pt aware 

## 2013-09-23 ENCOUNTER — Other Ambulatory Visit: Payer: Self-pay | Admitting: Internal Medicine

## 2013-09-26 ENCOUNTER — Other Ambulatory Visit: Payer: Self-pay | Admitting: Internal Medicine

## 2013-10-03 ENCOUNTER — Other Ambulatory Visit: Payer: Self-pay | Admitting: Internal Medicine

## 2013-10-03 NOTE — Telephone Encounter (Signed)
Pt is out of ambien. Please call rx into [facility] today

## 2013-10-06 ENCOUNTER — Ambulatory Visit: Payer: Medicare Other | Admitting: Internal Medicine

## 2013-10-07 ENCOUNTER — Other Ambulatory Visit: Payer: Self-pay | Admitting: *Deleted

## 2013-10-07 MED ORDER — ZOLPIDEM TARTRATE 10 MG PO TABS: ORAL_TABLET | ORAL | Status: DC

## 2013-10-08 ENCOUNTER — Ambulatory Visit: Payer: Medicare Other | Admitting: Internal Medicine

## 2013-10-08 ENCOUNTER — Telehealth: Payer: Self-pay | Admitting: Internal Medicine

## 2013-10-08 MED ORDER — PREGABALIN 150 MG PO CAPS: 150.0000 mg | ORAL_CAPSULE | Freq: Three times a day (TID) | ORAL | Status: DC

## 2013-10-08 MED ORDER — FLUTICASONE-SALMETEROL 250-50 MCG/DOSE IN AEPB: 1.0000 | INHALATION_SPRAY | Freq: Every day | RESPIRATORY_TRACT | Status: AC

## 2013-10-08 NOTE — Telephone Encounter (Signed)
rx sent in electronically 

## 2013-10-08 NOTE — Telephone Encounter (Signed)
[facility] [place], [place] is requesting re-fill on the following:  Fluticasone-Salmeterol (ADVAIR DISKUS) 250-50 MCG/DOSE AEPB pregabalin (LYRICA) 150 MG capsule

## 2013-10-13 ENCOUNTER — Telehealth: Payer: Self-pay | Admitting: Internal Medicine

## 2013-10-13 NOTE — Telephone Encounter (Signed)
[employer] denied Zolpidem for pt. I am going to supply you with a copy of the denial for your review.

## 2013-10-14 NOTE — Telephone Encounter (Signed)
Letter put in Dr Leanne Chang box for review

## 2013-10-23 ENCOUNTER — Telehealth: Payer: Self-pay | Admitting: *Deleted

## 2013-10-23 MED ORDER — ZOLPIDEM TARTRATE 10 MG PO TABS: ORAL_TABLET | ORAL | Status: DC

## 2013-10-23 NOTE — Telephone Encounter (Signed)
pts husband dropped off letter from [employer] stating that they were not going to cover the zolpidem.  He wanted [place] to resubmit the [facility] for a 90 day supply.  Ok per Dr Leanne Chang, rx [facility]

## 2013-11-11 ENCOUNTER — Encounter: Payer: Self-pay | Admitting: Internal Medicine

## 2013-11-11 ENCOUNTER — Ambulatory Visit (INDEPENDENT_AMBULATORY_CARE_PROVIDER_SITE_OTHER): Payer: Medicare Other | Admitting: Internal Medicine

## 2013-11-11 VITALS — BP 122/74 | HR 68 | Temp 97.8°F | Ht 62.0 in | Wt 147.0 lb

## 2013-11-11 DIAGNOSIS — M542 Cervicalgia: Secondary | ICD-10-CM

## 2013-11-11 DIAGNOSIS — J45909 Unspecified asthma, uncomplicated: Secondary | ICD-10-CM

## 2013-11-11 DIAGNOSIS — IMO0001 Reserved for inherently not codable concepts without codable children: Secondary | ICD-10-CM

## 2013-11-11 DIAGNOSIS — I10 Essential (primary) hypertension: Secondary | ICD-10-CM

## 2013-11-11 NOTE — Progress Notes (Signed)
Pre visit review using our clinic review tool, if applicable. No additional management support is needed unless otherwise documented below in the visit note. 

## 2013-11-11 NOTE — Patient Instructions (Signed)
prozac (fluoxetine) 20mg - take 1/2 pill for 2 weeks and then 1/2 pill every other day for 2 weeks and then stop.

## 2013-11-11 NOTE — Progress Notes (Signed)
htn- tolerating meds  Anxiety- she wonders about stopping prozac-- she is feeling better. She notes that she has been using [place] for years and can't fall asleep without it. Note she is taking 10 mg and she understands associated risks.  Fibromyalgia-- using lyrica- followed at [facility] Reviewed extensive lab work from [facility]  Past Medical History  Diagnosis Date  . GERD (gastroesophageal reflux disease)   . History of colonic polyps 08-19-2007    HYPERPLASTIC POLYP  . Atypical mycobacterial infection   . Hypertension   . Myalgia and myositis, unspecified   . Hyperlipidemia   . Headache(784.0)   . Depression   . Asthma   . Anxiety   . Hiatal hernia 08-19-2007    EGD  . Diverticulosis of colon (without mention of hemorrhage) 08-19-2007    Colonoscopy     History   Social History  . Marital Status: Married    Spouse Name: N/A    Number of Children: 2  . Years of Education: N/A   Occupational History  . Retired    Social History Main Topics  . Smoking status: Never Smoker   . Smokeless tobacco: Never Used  . Alcohol Use: No  . Drug Use: No  . Sexual Activity: Not on file   Other Topics Concern  . Not on file   Social History Narrative  . No narrative on file    Past Surgical History  Procedure Laterality Date  . Total abdominal hysterectomy w/ bilateral salpingoophorectomy    . Knee surgery      Left  . Breast surgery    . Toe spur      right toe spur removed  . Appendectomy    . Replacement total knee      Right 2009/Left   . Cataract extraction, bilateral    . Back surgery  12/2010    [place]  . Esophagogastroduodenoscopy      multiple  . Colonoscopy      Family History  Problem Relation Age of Onset  . Hypertension Mother   . Heart failure Mother   . Stroke Father 45  . Hypertension Sister   . Hypertension Brother   . Breast cancer Maternal Aunt   . Breast cancer Paternal Aunt     Allergies  Allergen Reactions  . Lisinopril     REACTION:  hacky cough    Current Outpatient Prescriptions on File Prior to Visit  Medication Sig Dispense Refill  . amLODipine (NORVASC) 5 MG tablet Take 1 tablet (5 mg total) by mouth daily.  90 tablet  1  . Ascorbic Acid (VITAMIN C) 500 MG tablet Take 500 mg by mouth daily.        Marland Kitchen dexlansoprazole (DEXILANT) 60 MG capsule Take 1 capsule (60 mg total) by mouth daily before breakfast.  90 capsule  0  . estrogens, conjugated, (PREMARIN) 0.625 MG tablet Take 1 tablet (0.625 mg total) by mouth daily.  90 tablet  1  . fish oil-omega-3 fatty acids 1000 MG capsule Take 2 g by mouth daily.      . Fluticasone-Salmeterol (ADVAIR DISKUS) 250-50 MCG/DOSE AEPB Inhale 1 puff into the lungs daily. 1 puff daily  180 each  3  . metoCLOPramide (REGLAN) 10 MG tablet Takes 1/2 to 1 tablet by mouth daily as needed for bloating  90 tablet  0  . pregabalin (LYRICA) 150 MG capsule Take 1 capsule (150 mg total) by mouth 3 (three) times daily.  270 capsule  1  .  zolpidem (AMBIEN) 10 MG tablet TAKE 1 TABLET AT BEDTIME AS NEEDED FOR INSOMNIA.  90 tablet  1   No current facility-administered medications on file prior to visit.     patient denies chest pain, shortness of breath, orthopnea. Denies lower extremity edema, abdominal pain, change in appetite, change in bowel movements. Patient denies rashes, musculoskeletal complaints. No other specific complaints in a complete review of systems.   BP 122/74  Pulse 68  Temp(Src) 97.8 F (36.6 C) (Oral)  Ht 5\' 2"  (1.575 m)  Wt 147 lb (66.679 kg)  BMI 26.88 kg/m2  Well-developed well-nourished female in no acute distress. HEENT exam atraumatic, normocephalic, extraocular muscles are intact. Neck is supple. No jugular venous distention no thyromegaly. Chest clear to auscultation without increased work of breathing. Cardiac exam S1 and S2 are regular. Abdominal exam active bowel sounds, soft, nontender. Extremities no edema. Neurologic exam she is alert without any motor sensory  deficits. Gait is normal.

## 2013-11-14 ENCOUNTER — Ambulatory Visit: Payer: Medicare Other

## 2013-11-14 NOTE — Assessment & Plan Note (Signed)
She is tolerating medications without difficulty. Continue the same.

## 2013-11-14 NOTE — Assessment & Plan Note (Signed)
Reviewed records from [facility]. She has a long history of fibromyalgia. She is using Lyrica with adequate results.

## 2013-11-14 NOTE — Assessment & Plan Note (Signed)
No symptoms. Continue current medications.

## 2013-11-17 ENCOUNTER — Ambulatory Visit: Payer: Medicare Other | Attending: Internal Medicine | Admitting: Physical Therapy

## 2013-11-17 DIAGNOSIS — M542 Cervicalgia: Secondary | ICD-10-CM | POA: Insufficient documentation

## 2013-11-17 DIAGNOSIS — M25519 Pain in unspecified shoulder: Secondary | ICD-10-CM | POA: Insufficient documentation

## 2013-11-17 DIAGNOSIS — IMO0001 Reserved for inherently not codable concepts without codable children: Secondary | ICD-10-CM | POA: Insufficient documentation

## 2013-11-19 ENCOUNTER — Ambulatory Visit: Payer: Medicare Other | Admitting: Physical Therapy

## 2013-11-20 ENCOUNTER — Ambulatory Visit: Payer: Medicare Other | Admitting: Physical Therapy

## 2013-11-24 ENCOUNTER — Ambulatory Visit: Payer: Medicare Other

## 2013-11-24 ENCOUNTER — Ambulatory Visit: Payer: Medicare Other | Admitting: Internal Medicine

## 2013-11-25 ENCOUNTER — Telehealth: Payer: Self-pay | Admitting: Internal Medicine

## 2013-11-25 NOTE — Telephone Encounter (Signed)
Pt states dr swords is helping her dc from FLUoxetine 20 MG capsule Pt thought she should fu sooner than her scheduled every 6 mos. pls advise on what pt should do?

## 2013-11-26 ENCOUNTER — Ambulatory Visit: Payer: Medicare Other

## 2013-11-28 ENCOUNTER — Ambulatory Visit: Payer: Medicare Other | Admitting: Physical Therapy

## 2013-12-01 ENCOUNTER — Ambulatory Visit: Payer: Medicare Other | Admitting: Physical Therapy

## 2013-12-03 ENCOUNTER — Ambulatory Visit: Payer: Medicare Other

## 2013-12-05 ENCOUNTER — Encounter: Payer: Medicare Other | Admitting: Physical Therapy

## 2013-12-08 ENCOUNTER — Other Ambulatory Visit: Payer: Self-pay | Admitting: *Deleted

## 2013-12-22 ENCOUNTER — Telehealth: Payer: Self-pay | Admitting: Gastroenterology

## 2013-12-22 MED ORDER — DEXLANSOPRAZOLE 60 MG PO CPDR: 60.0000 mg | DELAYED_RELEASE_CAPSULE | Freq: Every day | ORAL | Status: DC

## 2013-12-22 NOTE — Telephone Encounter (Signed)
Refill x 2 months or 1 3 month fill if she wants

## 2013-12-22 NOTE — Telephone Encounter (Signed)
LM on patient's mobile VM that rx sent in.

## 2013-12-22 NOTE — Telephone Encounter (Signed)
Patient has a follow up visit with you for 02-02-2014. Patient is requesting refill of Dexilant. Is it okay to refill until appointment?

## 2014-02-02 ENCOUNTER — Encounter: Payer: Self-pay | Admitting: Internal Medicine

## 2014-02-02 ENCOUNTER — Ambulatory Visit (INDEPENDENT_AMBULATORY_CARE_PROVIDER_SITE_OTHER): Payer: Medicare Other | Admitting: Internal Medicine

## 2014-02-02 VITALS — BP 128/70 | HR 80 | Ht 61.5 in | Wt 147.1 lb

## 2014-02-02 DIAGNOSIS — Z1211 Encounter for screening for malignant neoplasm of colon: Secondary | ICD-10-CM

## 2014-02-02 DIAGNOSIS — R1310 Dysphagia, unspecified: Secondary | ICD-10-CM

## 2014-02-02 NOTE — Patient Instructions (Signed)
Today we have ordered a cologuard test for you.  They will contact you about shipment.  Stay upright when you take your Ambien for 30 minutes or so.    I appreciate the opportunity to care for you.

## 2014-02-02 NOTE — Progress Notes (Addendum)
    Christina Hoover    [id number]    1935-12-17    Assessment and Plan/Recommendations:  1. Pill dysphagia: Likely because pt lies down immediately following administration of her Ambien prior to bedtime.  Instructed her on better technique of remaining upright after taking her pill.  Pt knows to call back and schedule an appt with continuing or worsening symptoms.  2. Screening for Colon CA:  Father passed away @ 5 and had rectal bleeding and ? Colon CA.  Last colon 2009 with hyperplastic polyp (not pre-cancerous).  Pt agrees to Cologuard screening, with colonoscopy for any positive results or alarm symptoms.   HPI: Christina Hoover is a 78 y.o. white female with history of colon polyps and esophageal strictures with dilation presenting today for evaluation.  She refused colonoscopy last year due to life stressors, but wanted to know about her future screening schedule.  She has also been experiencing pill dysphagia for the past several months, specifically with her nighttime Ambien, which she takes and then immediately lies down.  She denies dysphagia to solid foods or liquids, N/V, fever, chills, diarrhea, weight loss, or blood in stool.  She states she has occasional constipation which is managed well on an herb she has taken for many years.  Pts last EGD was in 2011 and was normal apart from an irregular z-line with biopsies taken, Surgical path not found in EMR    Review of systems: Positive for: pill dysphagia All other ROS negative or as per HPI  Physical Exam: BP 128/70  Pulse 80  Ht 5' 1.5" (1.562 m)  Wt 147 lb 2 oz (66.735 kg)  BMI 27.35 kg/m2 Constitutional: WDWN NAD Eyes: anicteric Mouth: oral and posterior pharynx free of lesions Neck: supple, no mass or thyromegaly Lungs: clear to auscultation bilaterally Cardiovascular: S1S2 with regular rate and rhythm, no rubs murmurs or gallops Abdomen: soft, nontender, nondistended, no masses or organomegaly, normal bowel  sounds Extremities: no lower extremity edema  Skin: no rash Neuro: alert and oriented x 3 Psych: normal mood and affect  Data Reviewed: Previous pt records, including endoscopies and labs.  Legrand Como A. Williams, [facility] 02/02/2014 11:55 AM   I appreciate the opportunity to care for this patient.  I have personally seen the patient, reviewed and repeated key elements of the history and physical and participated in formation of the assessment and plan the student has documented.  Gatha Mayer, MD, [facility]      .03/25/2014  Cologuard negative Consider repeat in 2018

## 2014-02-04 ENCOUNTER — Encounter: Payer: Self-pay | Admitting: Gastroenterology

## 2014-02-16 ENCOUNTER — Telehealth: Payer: Self-pay | Admitting: Internal Medicine

## 2014-02-16 NOTE — Telephone Encounter (Signed)
Pt needs refills on amlodipine 5 mg,premarin 0.625 mg,zolpidem 10 mg and lyrica 150 mg sent [facility] each w/refills

## 2014-02-19 ENCOUNTER — Telehealth: Payer: Self-pay

## 2014-02-19 MED ORDER — ESTROGENS CONJUGATED 0.625 MG PO TABS: 0.6250 mg | ORAL_TABLET | Freq: Every day | ORAL | Status: DC

## 2014-02-19 MED ORDER — ZOLPIDEM TARTRATE 10 MG PO TABS: ORAL_TABLET | ORAL | Status: DC

## 2014-02-19 MED ORDER — AMLODIPINE BESYLATE 5 MG PO TABS: 5.0000 mg | ORAL_TABLET | Freq: Every day | ORAL | Status: AC

## 2014-02-19 MED ORDER — PREGABALIN 150 MG PO CAPS: 150.0000 mg | ORAL_CAPSULE | Freq: Three times a day (TID) | ORAL | Status: AC

## 2014-02-19 NOTE — Telephone Encounter (Signed)
Amlodipine, premarin, zolpidem, and lyrica sent to [facility].

## 2014-02-19 NOTE — Telephone Encounter (Signed)
Received a fax from [facility] for amlodipine 5 mg, bupropion hcl ER 12 hour, and fluoxetine hcl.  On 02/02/2014 the fluoxetine hcl was cancelled.  Rx for amlodipine sent to pharmacy.  Pls advise on the other medicaitons.

## 2014-02-26 ENCOUNTER — Other Ambulatory Visit: Payer: Self-pay | Admitting: *Deleted

## 2014-02-26 MED ORDER — BUPROPION HCL ER (SR) 150 MG PO TB12: 150.0000 mg | ORAL_TABLET | Freq: Every day | ORAL | Status: AC

## 2014-02-26 MED ORDER — FLUOXETINE HCL 20 MG PO CAPS: 20.0000 mg | ORAL_CAPSULE | Freq: Every day | ORAL | Status: AC

## 2014-02-27 ENCOUNTER — Telehealth: Payer: Self-pay | Admitting: Internal Medicine

## 2014-02-27 NOTE — Telephone Encounter (Signed)
Pt zolpidem qty limit has been denied. Pt can only get 90 per year. Pt can not get 30 for 30 day.

## 2014-03-11 LAB — COLOGUARD: Cologuard: NEGATIVE

## 2014-03-20 NOTE — Telephone Encounter (Signed)
Call her and see what she would like to do

## 2014-03-25 ENCOUNTER — Other Ambulatory Visit: Payer: Self-pay

## 2014-03-25 ENCOUNTER — Telehealth: Payer: Self-pay

## 2014-03-25 LAB — COLOGUARD

## 2014-03-25 MED ORDER — DEXLANSOPRAZOLE 60 MG PO CPDR: 60.0000 mg | DELAYED_RELEASE_CAPSULE | Freq: Every day | ORAL | Status: DC

## 2014-03-25 NOTE — Telephone Encounter (Signed)
Patient notified Recall entered  She requested a refill of Dexilant.  This has been sent to her mailorder pharmacy at her request

## 2014-03-25 NOTE — Telephone Encounter (Signed)
Message copied by Marlon Pel on Wed Mar 25, 2014  2:06 PM ------      Message from: Silvano Rusk E      Created: Wed Mar 25, 2014  1:52 PM      Regarding: cologuard negative       Let her know Cologuard was negative      She can consider repeating it in 3 years ------

## 2014-03-25 NOTE — Telephone Encounter (Signed)
Informed patient that cologuard was negative.

## 2014-04-01 ENCOUNTER — Encounter: Payer: Self-pay | Admitting: Internal Medicine

## 2014-04-02 NOTE — Telephone Encounter (Signed)
Pt stated she will pay out of pocket if she needs to

## 2014-04-20 ENCOUNTER — Telehealth: Payer: Self-pay | Admitting: Internal Medicine

## 2014-04-20 NOTE — Telephone Encounter (Signed)
[facility] [place], [address] Is requesting re-fill on zolpidem (AMBIEN) 10 MG tablet

## 2014-04-21 ENCOUNTER — Other Ambulatory Visit: Payer: Self-pay | Admitting: Internal Medicine

## 2014-04-23 NOTE — Telephone Encounter (Signed)
Pt had another physician fill this.  She no longer needs it

## 2014-04-23 NOTE — Telephone Encounter (Signed)
I thought insurance did not cover this medication anymore.  Left message for pt to call back

## 2014-05-11 DIAGNOSIS — N393 Stress incontinence (female) (male): Secondary | ICD-10-CM

## 2014-05-11 DIAGNOSIS — N3941 Urge incontinence: Secondary | ICD-10-CM | POA: Insufficient documentation

## 2014-05-11 HISTORY — DX: Urge incontinence: N39.41

## 2014-05-11 HISTORY — DX: Stress incontinence (female) (male): N39.3

## 2014-06-22 HISTORY — PX: OTHER SURGICAL HISTORY: SHX169

## 2014-06-23 ENCOUNTER — Non-Acute Institutional Stay (SKILLED_NURSING_FACILITY): Payer: Medicare Other | Admitting: Internal Medicine

## 2014-06-23 DIAGNOSIS — N393 Stress incontinence (female) (male): Secondary | ICD-10-CM

## 2014-06-23 DIAGNOSIS — N8110 Cystocele, unspecified: Secondary | ICD-10-CM

## 2014-06-23 DIAGNOSIS — J4520 Mild intermittent asthma, uncomplicated: Secondary | ICD-10-CM

## 2014-06-23 DIAGNOSIS — K2100 Gastro-esophageal reflux disease with esophagitis, without bleeding: Secondary | ICD-10-CM

## 2014-06-23 DIAGNOSIS — F329 Major depressive disorder, single episode, unspecified: Secondary | ICD-10-CM

## 2014-06-23 DIAGNOSIS — N952 Postmenopausal atrophic vaginitis: Secondary | ICD-10-CM

## 2014-06-23 DIAGNOSIS — I10 Essential (primary) hypertension: Secondary | ICD-10-CM

## 2014-06-23 DIAGNOSIS — F32A Depression, unspecified: Secondary | ICD-10-CM

## 2014-06-23 DIAGNOSIS — M961 Postlaminectomy syndrome, not elsewhere classified: Secondary | ICD-10-CM

## 2014-06-23 DIAGNOSIS — K589 Irritable bowel syndrome without diarrhea: Secondary | ICD-10-CM

## 2014-06-23 DIAGNOSIS — N3941 Urge incontinence: Secondary | ICD-10-CM

## 2014-06-24 ENCOUNTER — Encounter: Payer: Self-pay | Admitting: Internal Medicine

## 2014-06-24 DIAGNOSIS — N952 Postmenopausal atrophic vaginitis: Secondary | ICD-10-CM | POA: Insufficient documentation

## 2014-06-24 DIAGNOSIS — N8110 Cystocele, unspecified: Secondary | ICD-10-CM | POA: Insufficient documentation

## 2014-06-24 NOTE — Progress Notes (Signed)
Patient ID: Christina Hoover, female   DOB: July 31, 1935, 79 y.o.   MRN: [id number]  Provider:  Rexene Edison. Mariea Clonts, D.O., C.M.D. Location:  [facility]  PCP: Darlina Rumpf Paat  Code Status: full code  Allergies  Allergen Reactions  . Lisinopril     REACTION: hacky cough    Chief Complaint  Patient presents with  . New Admit To SNF    from [facility]    HPI: 79 y.o. female with h/o depression, fibromyalgia, back pain, hypertension, colon polyps, asthma, GERD, and recent difficulty with atrophic vaginitis, mixed stress and urge incontinence, constipation and female bladder prolapse was seen for admission to skilled rehab after undergoing cystourethroscopy 06/22/14, sling for stress incontinence using cadaveric fascia lata 06/22/14, and robot assisted colpopexy for vaginal suspension 06/22/14 by Dr. Luciano Cutter at [facility].  She is to f/u with there, but appt will be arranged.   Apparently, she also had a urethral graft.  She is doing quite well...she has only used tylenol when she first arrived and nothing since.  She has been up out of bed and walking around her room.  She has mild discomfort in her lower abdomen and still feels a bit bloated.  Her primary concern is that she is having to urinate standing up at this point which she is attributing to edema from the surgery.  At first, she was unable to urinate at all.  She is now using a female urinal to void.  Her bowels did move and she experienced great relief with that after taking her herbalax.    In terms of her history, she had a prior failed back surgery and made an amazing recovery learning to walk again after that.    ROS: Review of Systems  Constitutional: Positive for malaise/fatigue. Negative for fever.  HENT: Negative for congestion.   Eyes: Negative for blurred vision.  Respiratory: Negative for shortness of breath.   Cardiovascular: Negative for chest pain and leg swelling.  Gastrointestinal: Positive for heartburn, abdominal pain  and constipation. Negative for blood in stool and melena.       Bowels moved now and feels better  Genitourinary: Negative for dysuria, urgency, frequency, hematuria and flank pain.       Having to urinate standing up right now  Musculoskeletal: Positive for myalgias and back pain. Negative for falls.  Skin: Negative for rash.  Neurological: Negative for dizziness and weakness.  Endo/Heme/Allergies: Does not bruise/bleed easily.  Psychiatric/Behavioral: Negative for depression and memory loss.     Past Medical History  Diagnosis Date  . GERD (gastroesophageal reflux disease)   . History of colonic polyps 08-19-2007    HYPERPLASTIC POLYP  . Atypical mycobacterial infection   . Hypertension   . Myalgia and myositis, unspecified   . Hyperlipidemia   . Headache(784.0)   . Depression   . Asthma   . Anxiety   . Hiatal hernia 08-19-2007    EGD  . Diverticulosis of colon (without mention of hemorrhage) 08-19-2007    Colonoscopy   . Urge urinary incontinence 05/11/14  . Urinary, incontinence, stress female 05/11/14  . Atrophic vaginitis   . Female bladder prolapse   . Epiretinal membrane    Past Surgical History  Procedure Laterality Date  . Total abdominal hysterectomy w/ bilateral salpingoophorectomy    . Knee surgery Left   . Breast surgery    . Toe spur Right     right toe spur removed  . Appendectomy    . Replacement  total knee Bilateral     Right 2009/Left   . Cataract extraction, bilateral    . Back surgery  12/2010    [place]  . Esophagogastroduodenoscopy      multiple  . Colonoscopy    . Robot assisted colopopexy for suspension vaginal  06/22/14    Dr. Luciano Cutter (gen surgery)   Social History:   reports that she has never smoked. She has never used smokeless tobacco. She reports that she does not drink alcohol or use illicit drugs.  Family History  Problem Relation Age of Onset  . Hypertension Mother   . Heart failure Mother   . Stroke Father 91  .  Hypertension Sister   . Hypertension Brother   . Breast cancer Maternal Aunt   . Breast cancer Paternal Aunt     Medications: Patient's Medications  New Prescriptions   No medications on file  Previous Medications   AMLODIPINE (NORVASC) 5 MG TABLET    Take 1 tablet (5 mg total) by mouth daily.   ASCORBIC ACID (VITAMIN C) 500 MG TABLET    Take 500 mg by mouth daily.     BUPROPION (WELLBUTRIN SR) 150 MG 12 HR TABLET    Take 1 tablet (150 mg total) by mouth daily.   CONJUGATED ESTROGENS (PREMARIN) VAGINAL CREAM    Place vaginally.   DEXLANSOPRAZOLE (DEXILANT) 60 MG CAPSULE    Take by mouth.   FISH OIL-OMEGA-3 FATTY ACIDS 1000 MG CAPSULE    Take 2 g by mouth daily.   FLUOXETINE (PROZAC) 20 MG CAPSULE    Take 1 capsule (20 mg total) by mouth daily.   FLUTICASONE-SALMETEROL (ADVAIR DISKUS) 250-50 MCG/DOSE AEPB    Inhale 1 puff into the lungs daily. 1 puff daily   METOCLOPRAMIDE (REGLAN) 10 MG TABLET    Takes 1/2 to 1 tablet by mouth daily as needed for bloating   OMEGA-3 FATTY ACIDS (FISH OIL) 1000 MG CAPS    Take by mouth.   OXYCODONE (OXY IR/ROXICODONE) 5 MG IMMEDIATE RELEASE TABLET    Take by mouth.   PREGABALIN (LYRICA) 150 MG CAPSULE    Take 1 capsule (150 mg total) by mouth 3 (three) times daily.   ZOLPIDEM (AMBIEN) 10 MG TABLET    Take by mouth.  Modified Medications   No medications on file  Discontinued Medications   DEXLANSOPRAZOLE (DEXILANT) 60 MG CAPSULE    Take 1 capsule (60 mg total) by mouth daily before breakfast.   ESTROGENS, CONJUGATED, (PREMARIN) 0.625 MG TABLET    Take 1 tablet (0.625 mg total) by mouth daily.   ZOLPIDEM (AMBIEN) 10 MG TABLET    TAKE 1 TABLET AT BEDTIME AS NEEDED FOR INSOMNIA.     Physical Exam: Filed Vitals:   06/24/14 1051  BP: 137/70  Pulse: 89  Temp: 98.2 F (36.8 C)  Resp: 18  SpO2: 94%   Physical Exam  Constitutional: She is oriented to person, place, and time. She appears well-developed and well-nourished. No distress.    Cardiovascular: Normal rate, regular rhythm, normal heart sounds and intact distal pulses.   Pulmonary/Chest: Effort normal and breath sounds normal. She has no wheezes.  Abdominal: Soft. Bowel sounds are normal. She exhibits no mass. There is tenderness. There is no rebound and no guarding. No hernia.  Musculoskeletal: Normal range of motion.  Neurological: She is alert and oriented to person, place, and time.  Skin: Skin is warm and dry.  Has ecchymoses surrounding the laparoscopic wounds on her abdomen,  steristrips in place  Psychiatric: She has a normal mood and affect. Her behavior is normal. Judgment and thought content normal.    Labs reviewed: Last 06/23/14 in [place] records.  Imaging and Procedures: Reviewed in [facility] records.  Copy printed for [facility] patient chart in rehab.  Assessment/Plan 1. Urge urinary incontinence -resolved with surgery it seems at this point, cont to monitor  2. Urinary, incontinence, stress female -seems this resolved, as well, cont to monitor  3. Atrophic vaginitis -continues with premarin cream  4. Female bladder prolapse -s/p vaginal sling and urethral graft -having some difficulty voiding in a seated position--having to stand up at present--should improve as inflammation goes down--if not, pt will call Dr. Marin Comment -pain is well controlled with tylenol only  5. Essential hypertension, benign -bp at goal with amlodipine, cont to monitor  6. Mild intermittent asthma, uncomplicated -cont advair, no signs of acute exacerbation, sats are normal, breathing well  7. Irritable bowel syndrome -uses otc herbalax to help with constipation--had one large bm here thus far, cont to monitor  8. Failed back syndrome of lumbar spine -had to learn to walk again after this--has done amazingly well and only chronic pain med is lyrica  9. Depression -cont wellbutrin and prozac, mood is quite good;  Also uses ambien for sleep  10.  GERD with esophagitis -cont  nexium, also uses reglan  F/u Dr. Marin Comment when the office contacts her F/u Dr. Tomi Likens at [facility] 07/17/14 F/u Dr.  Sharlynn Oliph[facility] 07/21/14  Functional status:  independent  Family/ staff Communication: discussed with resident and her nurse  Labs/tests ordered:  F/u cbc, bmp

## 2014-06-28 ENCOUNTER — Encounter: Payer: Self-pay | Admitting: Internal Medicine

## 2014-10-21 ENCOUNTER — Other Ambulatory Visit: Payer: Self-pay | Admitting: Internal Medicine

## 2015-05-10 ENCOUNTER — Other Ambulatory Visit: Payer: Self-pay | Admitting: Internal Medicine

## 2015-06-05 ENCOUNTER — Emergency Department (HOSPITAL_BASED_OUTPATIENT_CLINIC_OR_DEPARTMENT_OTHER)
Admission: EM | Admit: 2015-06-05 | Discharge: 2015-06-06 | Disposition: A | Discharge status: Home / Self Care | Payer: Medicare Other | Attending: Emergency Medicine | Admitting: Emergency Medicine

## 2015-06-05 ENCOUNTER — Encounter (HOSPITAL_BASED_OUTPATIENT_CLINIC_OR_DEPARTMENT_OTHER): Payer: Self-pay | Admitting: Emergency Medicine

## 2015-06-05 ENCOUNTER — Emergency Department (HOSPITAL_BASED_OUTPATIENT_CLINIC_OR_DEPARTMENT_OTHER): Payer: Medicare Other

## 2015-06-05 DIAGNOSIS — R200 Anesthesia of skin: Secondary | ICD-10-CM | POA: Insufficient documentation

## 2015-06-05 DIAGNOSIS — Z8669 Personal history of other diseases of the nervous system and sense organs: Secondary | ICD-10-CM | POA: Insufficient documentation

## 2015-06-05 DIAGNOSIS — R202 Paresthesia of skin: Secondary | ICD-10-CM | POA: Insufficient documentation

## 2015-06-05 DIAGNOSIS — Z7951 Long term (current) use of inhaled steroids: Secondary | ICD-10-CM | POA: Diagnosis not present

## 2015-06-05 DIAGNOSIS — M791 Myalgia: Secondary | ICD-10-CM | POA: Insufficient documentation

## 2015-06-05 DIAGNOSIS — Z8742 Personal history of other diseases of the female genital tract: Secondary | ICD-10-CM | POA: Insufficient documentation

## 2015-06-05 DIAGNOSIS — Z79899 Other long term (current) drug therapy: Secondary | ICD-10-CM | POA: Diagnosis not present

## 2015-06-05 DIAGNOSIS — Z8619 Personal history of other infectious and parasitic diseases: Secondary | ICD-10-CM | POA: Insufficient documentation

## 2015-06-05 DIAGNOSIS — F419 Anxiety disorder, unspecified: Secondary | ICD-10-CM | POA: Insufficient documentation

## 2015-06-05 DIAGNOSIS — R110 Nausea: Secondary | ICD-10-CM | POA: Diagnosis not present

## 2015-06-05 DIAGNOSIS — R6883 Chills (without fever): Secondary | ICD-10-CM | POA: Insufficient documentation

## 2015-06-05 DIAGNOSIS — R51 Headache: Secondary | ICD-10-CM | POA: Insufficient documentation

## 2015-06-05 DIAGNOSIS — Z87448 Personal history of other diseases of urinary system: Secondary | ICD-10-CM | POA: Diagnosis not present

## 2015-06-05 DIAGNOSIS — R42 Dizziness and giddiness: Secondary | ICD-10-CM | POA: Diagnosis not present

## 2015-06-05 DIAGNOSIS — J45909 Unspecified asthma, uncomplicated: Secondary | ICD-10-CM | POA: Diagnosis not present

## 2015-06-05 DIAGNOSIS — R5383 Other fatigue: Secondary | ICD-10-CM | POA: Insufficient documentation

## 2015-06-05 DIAGNOSIS — Z86010 Personal history of colonic polyps: Secondary | ICD-10-CM | POA: Insufficient documentation

## 2015-06-05 DIAGNOSIS — K219 Gastro-esophageal reflux disease without esophagitis: Secondary | ICD-10-CM | POA: Insufficient documentation

## 2015-06-05 DIAGNOSIS — I10 Essential (primary) hypertension: Secondary | ICD-10-CM | POA: Insufficient documentation

## 2015-06-05 DIAGNOSIS — M25512 Pain in left shoulder: Secondary | ICD-10-CM | POA: Insufficient documentation

## 2015-06-05 DIAGNOSIS — R519 Headache, unspecified: Secondary | ICD-10-CM

## 2015-06-05 LAB — CBC WITH DIFFERENTIAL/PLATELET
Basophils Absolute: 0 10*3/uL (ref 0.0–0.1)
Basophils Relative: 0 %
EOS ABS: 0.2 10*3/uL (ref 0.0–0.7)
EOS PCT: 2 %
HCT: 47.9 % — ABNORMAL HIGH (ref 36.0–46.0)
HEMOGLOBIN: 16.1 g/dL — AB (ref 12.0–15.0)
LYMPHS ABS: 1.7 10*3/uL (ref 0.7–4.0)
Lymphocytes Relative: 18 %
MCH: 29.3 pg (ref 26.0–34.0)
MCHC: 33.6 g/dL (ref 30.0–36.0)
MCV: 87.2 fL (ref 78.0–100.0)
MONO ABS: 0.7 10*3/uL (ref 0.1–1.0)
MONOS PCT: 7 %
NEUTROS PCT: 73 %
Neutro Abs: 6.5 10*3/uL (ref 1.7–7.7)
Platelets: 344 10*3/uL (ref 150–400)
RBC: 5.49 MIL/uL — ABNORMAL HIGH (ref 3.87–5.11)
RDW: 13.4 % (ref 11.5–15.5)
WBC: 9 10*3/uL (ref 4.0–10.5)

## 2015-06-05 LAB — COMPREHENSIVE METABOLIC PANEL
ALBUMIN: 4.7 g/dL (ref 3.5–5.0)
ALK PHOS: 83 U/L (ref 38–126)
ALT: 17 U/L (ref 14–54)
AST: 28 U/L (ref 15–41)
Anion gap: 12 (ref 5–15)
BUN: 9 mg/dL (ref 6–20)
CALCIUM: 9.5 mg/dL (ref 8.9–10.3)
CHLORIDE: 109 mmol/L (ref 101–111)
CO2: 22 mmol/L (ref 22–32)
CREATININE: 0.78 mg/dL (ref 0.44–1.00)
GFR calc Af Amer: >60 mL/min (ref >60)
GFR calc non Af Amer: >60 mL/min (ref >60)
GLUCOSE: 114 mg/dL — AB (ref 65–99)
Potassium: 3.8 mmol/L (ref 3.5–5.1)
SODIUM: 143 mmol/L (ref 135–145)
Total Bilirubin: 0.9 mg/dL (ref 0.3–1.2)
Total Protein: 7.8 g/dL (ref 6.5–8.1)

## 2015-06-05 LAB — URINALYSIS, ROUTINE W REFLEX MICROSCOPIC
BILIRUBIN URINE: NEGATIVE
GLUCOSE, UA: NEGATIVE mg/dL
HGB URINE DIPSTICK: NEGATIVE
Ketones, ur: 15 mg/dL — AB
Leukocytes, UA: NEGATIVE
Nitrite: NEGATIVE
PH: 8.5 — AB (ref 5.0–8.0)
Protein, ur: NEGATIVE mg/dL
SPECIFIC GRAVITY, URINE: 1.013 (ref 1.005–1.030)

## 2015-06-05 LAB — TROPONIN I

## 2015-06-05 MED ORDER — FENTANYL CITRATE (PF) 100 MCG/2ML IJ SOLN
50.0000 ug | Freq: Once | INTRAMUSCULAR | Status: AC
  Administered 2015-06-05: 50 ug via INTRAVENOUS
  Filled 2015-06-05: qty 2

## 2015-06-05 MED ORDER — KETOROLAC TROMETHAMINE 30 MG/ML IJ SOLN
30.0000 mg | Freq: Once | INTRAMUSCULAR | Status: AC
  Administered 2015-06-05: 30 mg via INTRAVENOUS
  Filled 2015-06-05: qty 1

## 2015-06-05 MED ORDER — SODIUM CHLORIDE 0.9 % IV BOLUS (SEPSIS)
500.0000 mL | Freq: Once | INTRAVENOUS | Status: AC
  Administered 2015-06-05: 500 mL via INTRAVENOUS

## 2015-06-05 MED ORDER — ONDANSETRON HCL 4 MG/2ML IJ SOLN
4.0000 mg | Freq: Once | INTRAMUSCULAR | Status: AC
Start: 2015-06-05 — End: 2015-06-05
  Administered 2015-06-05: 4 mg via INTRAVENOUS
  Filled 2015-06-05: qty 2

## 2015-06-05 NOTE — ED Notes (Signed)
Patient reports that she has a Headache and Nausea with intermittent tingling to her left arm

## 2015-06-05 NOTE — ED Provider Notes (Addendum)
CSN: [id number]     Arrival date & time 06/05/15  2043 History  By signing my name below, I, Terrance Branch, attest that this documentation has been prepared under the direction and in the presence of Malvin Johns, MD. Electronically Signed: Randa Evens, ED Scribe. 06/06/2015. 12:04 AM.     Chief Complaint  Patient presents with  . Tingling    The history is provided by the patient. No language interpreter was used.   HPI Comments: Christina Hoover is a 79 y.o. female who presents to the Emergency Department with multiple complaints. Pt is complaining of chills, left shoulder pain, paresthesia in her left hand, nausea, temporal HA, dizziness and fatigue. Pt states that she has not been feeling well for the past 3 days. Reports slight CP last night but none since then. Pt describes the dizziness as a light headed feeling. Pt doesn't report any medications PTA. Denies fever, vomiting, cough, congestion, sore throat, rhinorrhea, post nasal drainage, abdominal pain or dysuria.  She states that she has been generally weak for last 3 days, worse today.  She states she felt like she may be coming down with the flu although she's had no fevers. She has a history of migraines and states her headache today is similar to that although she hasn't had a migraine about 10 years. She states it started gradually earlier today and got worse throughout the day. She denies any neck pain.  Hx of fibromyalgia   Past Medical History  Diagnosis Date  . GERD (gastroesophageal reflux disease)   . History of colonic polyps 08-19-2007    HYPERPLASTIC POLYP  . Atypical mycobacterial infection   . Hypertension   . Myalgia and myositis, unspecified   . Hyperlipidemia   . Headache(784.0)   . Depression   . Asthma   . Anxiety   . Hiatal hernia 08-19-2007    EGD  . Diverticulosis of colon (without mention of hemorrhage) 08-19-2007    Colonoscopy   . Urge urinary incontinence 05/11/14  . Urinary, incontinence,  stress female 05/11/14  . Atrophic vaginitis   . Female bladder prolapse   . Epiretinal membrane    Past Surgical History  Procedure Laterality Date  . Total abdominal hysterectomy w/ bilateral salpingoophorectomy    . Knee surgery Left   . Breast surgery    . Toe spur Right     right toe spur removed  . Appendectomy    . Replacement total knee Bilateral     Right 2009/Left   . Cataract extraction, bilateral    . Back surgery  12/2010    [place]  . Esophagogastroduodenoscopy      multiple  . Colonoscopy    . Robot assisted colopopexy for suspension vaginal  06/22/14    Dr. Luciano Cutter (gen surgery)   Family History  Problem Relation Age of Onset  . Hypertension Mother   . Heart failure Mother   . Stroke Father 32  . Hypertension Sister   . Hypertension Brother   . Breast cancer Maternal Aunt   . Breast cancer Paternal Aunt    Social History  Substance Use Topics  . Smoking status: Never Smoker   . Smokeless tobacco: Never Used  . Alcohol Use: No   OB History    No data available     Review of Systems  Constitutional: Positive for chills and fatigue. Negative for fever and diaphoresis.  HENT: Negative for congestion, postnasal drip, rhinorrhea, sneezing and sore throat.  Eyes: Negative.   Respiratory: Negative for cough, chest tightness and shortness of breath.   Cardiovascular: Negative for chest pain and leg swelling.  Gastrointestinal: Positive for nausea. Negative for vomiting, abdominal pain, diarrhea and blood in stool.  Genitourinary: Negative for frequency, hematuria, flank pain and difficulty urinating.  Musculoskeletal: Positive for myalgias and arthralgias. Negative for back pain.  Skin: Negative for rash.  Neurological: Positive for dizziness, numbness and headaches. Negative for speech difficulty and weakness.      Allergies  Lisinopril  Home Medications   Prior to Admission medications   Medication Sig Start Date End Date Taking?  Authorizing Provider  amLODipine (NORVASC) 5 MG tablet Take 1 tablet (5 mg total) by mouth daily. 02/19/14   Lisabeth Pick, MD  Ascorbic Acid (VITAMIN C) 500 MG tablet Take 500 mg by mouth daily.      Historical Provider, MD  buPROPion (WELLBUTRIN SR) 150 MG 12 hr tablet Take 1 tablet (150 mg total) by mouth daily. 02/26/14   Lisabeth Pick, MD  dexlansoprazole (DEXILANT) 60 MG capsule Take by mouth.    Historical Provider, MD  fish oil-omega-3 fatty acids 1000 MG capsule Take 2 g by mouth daily.    Historical Provider, MD  FLUoxetine (PROZAC) 20 MG capsule Take 1 capsule (20 mg total) by mouth daily. 02/26/14   Lisabeth Pick, MD  Fluticasone-Salmeterol (ADVAIR DISKUS) 250-50 MCG/DOSE AEPB Inhale 1 puff into the lungs daily. 1 puff daily 10/08/13   Lisabeth Pick, MD  metoCLOPramide (REGLAN) 10 MG tablet Takes 1/2 to 1 tablet by mouth daily as needed for bloating 06/21/12   Sable Feil, MD  Omega-3 Fatty Acids (FISH OIL) 1000 MG CAPS Take by mouth.    Historical Provider, MD  pregabalin (LYRICA) 150 MG capsule Take 1 capsule (150 mg total) by mouth 3 (three) times daily. 02/19/14 02/19/15  Darrick Penna Swords, MD   BP 164/86 mmHg  Pulse 78  Temp(Src) 97.9 F (36.6 C) (Oral)  Resp 18  Ht 5\' 2"  (1.575 m)  Wt 137 lb (62.143 kg)  BMI 25.05 kg/m2  SpO2 100%   Physical Exam  Constitutional: She is oriented to person, place, and time. She appears well-developed and well-nourished.  HENT:  Head: Normocephalic and atraumatic.  Mouth/Throat: Oropharynx is clear and moist. No oropharyngeal exudate.  Eyes: Pupils are equal, round, and reactive to light.  Neck: Normal range of motion. Neck supple.  No meningismus  Cardiovascular: Normal rate, regular rhythm and normal heart sounds.   Pulmonary/Chest: Effort normal and breath sounds normal. No respiratory distress. She has no wheezes. She has no rales. She exhibits no tenderness.  Abdominal: Soft. Bowel sounds are normal. There is no tenderness. There is  no rebound and no guarding.  Musculoskeletal: Normal range of motion. She exhibits no edema.  Lymphadenopathy:    She has no cervical adenopathy.  Neurological: She is alert and oriented to person, place, and time. She has normal strength. No cranial nerve deficit or sensory deficit. GCS eye subscore is 4. GCS verbal subscore is 5. GCS motor subscore is 6.  Finger-nose intact, no pronator drift  Skin: Skin is warm and dry. No rash noted.  Psychiatric: She has a normal mood and affect.    ED Course  Procedures (including critical care time) DIAGNOSTIC STUDIES: Oxygen Saturation is 100% on RA, normal by my interpretation.    COORDINATION OF CARE: 10:06 PM-Discussed treatment plan with pt at bedside and pt agreed to plan.  Labs Review Labs Reviewed  COMPREHENSIVE METABOLIC PANEL - Abnormal; Notable for the following:    Glucose, Bld 114 (*)    All other components within normal limits  CBC WITH DIFFERENTIAL/PLATELET - Abnormal; Notable for the following:    RBC 5.49 (*)    Hemoglobin 16.1 (*)    HCT 47.9 (*)    All other components within normal limits  URINALYSIS, ROUTINE W REFLEX MICROSCOPIC (NOT AT [facility]) - Abnormal; Notable for the following:    APPearance CLOUDY (*)    pH 8.5 (*)    Ketones, ur 15 (*)    All other components within normal limits  TROPONIN I    Imaging Review Ct Head Wo Contrast  06/05/2015  CLINICAL DATA:  Headache, chills, left shoulder pain, paresthesias in the left hand, nausea, dizziness, and fatigue. Patient is not been feeling well for 3 days. EXAM: CT HEAD WITHOUT CONTRAST TECHNIQUE: Contiguous axial images were obtained from the base of the skull through the vertex without intravenous contrast. COMPARISON:  None. FINDINGS: Mild cerebral atrophy. Ventricular dilatation likely due to central atrophy. Minimal patchy low-attenuation changes in the deep white matter consistent with small vessel ischemia. No mass effect or midline shift. No abnormal  extra-axial fluid collections. Gray-white matter junctions are distinct. Basal cisterns are not effaced. No evidence of acute intracranial hemorrhage. No depressed skull fractures. Visualized paranasal sinuses and mastoid air cells are not opacified. IMPRESSION: No acute intracranial abnormalities. Chronic atrophy and small vessel ischemic changes. Electronically Signed   By: Lucienne Capers M.D.   On: 06/05/2015 23:10      EKG Interpretation   Date/Time:  Saturday June 05 2015 21:49:06 EST Ventricular Rate:  72 PR Interval:  150 QRS Duration: 84 QT Interval:  442 QTC Calculation: 483 R Axis:   5 Text Interpretation:  Normal sinus rhythm Possible Left atrial enlargement  Nonspecific ST abnormality Abnormal ECG since last tracing no significant  change Confirmed by Fergie Sherbert  MD, Avie Checo ([id number]) on 06/05/2015 11:04:48 PM      MDM   Final diagnoses:  Headache in front of head   Patient presents with generalized weakness so she with a gradual onset bifrontal headache. She's afebrile. Her labs are unremarkable. There is no abnormalities on CT scan of her head. No evidence of intracranial hemorrhage. No suggestions of meningitis or subarachnoid hemorrhage on clinical exam.  Pt initially feeling better, headache improving.  However, after eating some crackers, started to vomit.  Will try Reglan.  Dr. Florina Ou to follow.  I personally performed the services described in this documentation, which was scribed in my presence.  The recorded information has been reviewed and considered.      Malvin Johns, MD 06/06/15 [id number]  Malvin Johns, MD 06/06/15 [phone]

## 2015-06-05 NOTE — ED Notes (Signed)
Patient transported to CT 

## 2015-06-06 DIAGNOSIS — R51 Headache: Secondary | ICD-10-CM | POA: Diagnosis not present

## 2015-06-06 MED ORDER — PROMETHAZINE HCL 25 MG/ML IJ SOLN
12.5000 mg | Freq: Once | INTRAMUSCULAR | Status: AC
  Administered 2015-06-06: 12.5 mg via INTRAVENOUS
  Filled 2015-06-06: qty 1

## 2015-06-06 MED ORDER — LORAZEPAM 2 MG/ML IJ SOLN: 1.0000 mg | Freq: Once | INTRAMUSCULAR | Status: DC

## 2015-06-06 MED ORDER — METOCLOPRAMIDE HCL 5 MG/ML IJ SOLN
10.0000 mg | Freq: Once | INTRAMUSCULAR | Status: AC
  Administered 2015-06-06: 10 mg via INTRAVENOUS
  Filled 2015-06-06: qty 2

## 2015-06-06 NOTE — ED Provider Notes (Signed)
1:58 AM Patient feeling better states she is ready to go after IV Phenergan.    Shanon Rosser, MD 06/06/15 [phone]

## 2015-06-06 NOTE — Discharge Instructions (Signed)

## 2016-04-28 ENCOUNTER — Encounter: Payer: Self-pay | Admitting: Vascular Surgery

## 2016-05-16 IMAGING — CT CT HEAD W/O CM
1 of 2 series · 16 of 30 positions shown, 20 images · non-contrast
Comparison: None.

CLINICAL DATA: Headache, chills, left shoulder pain, paresthesias
in the left hand, nausea, dizziness, and fatigue. Patient is not
been feeling well for 3 days.

EXAM:
CT HEAD WITHOUT CONTRAST
TECHNIQUE: Contiguous axial images were obtained from the base of the skull
through the vertex without intravenous contrast.

[Series 2: head 4.8 h37s · axial · 0.45mm/px · z∈[-169,-32]mm · 16 of 32 slices shown, 20 images]
[im 2/32  brain]
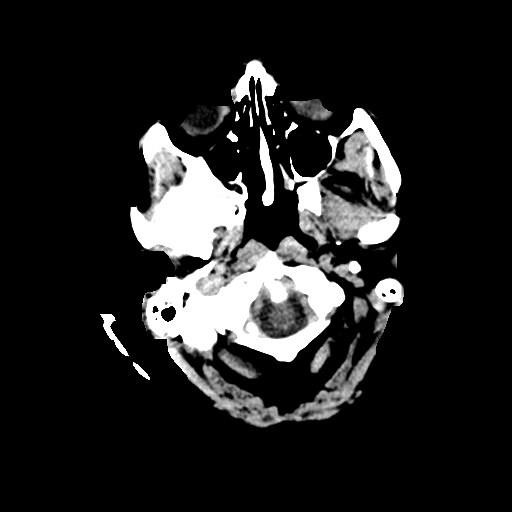
[im 2/32  bone]
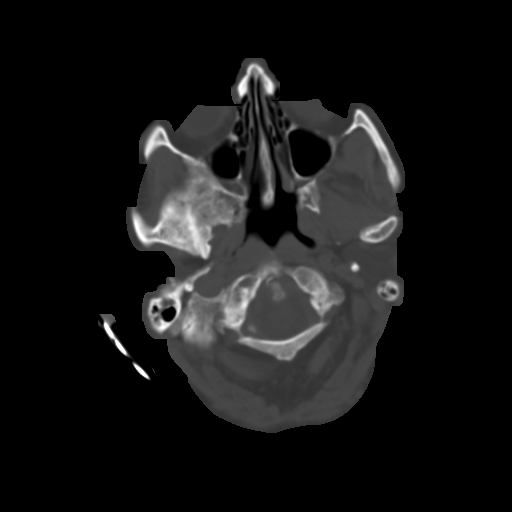
[im 4/32  brain]
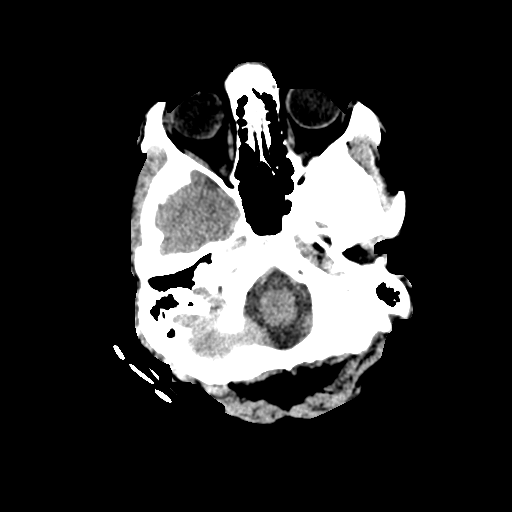
[im 5/32  brain]
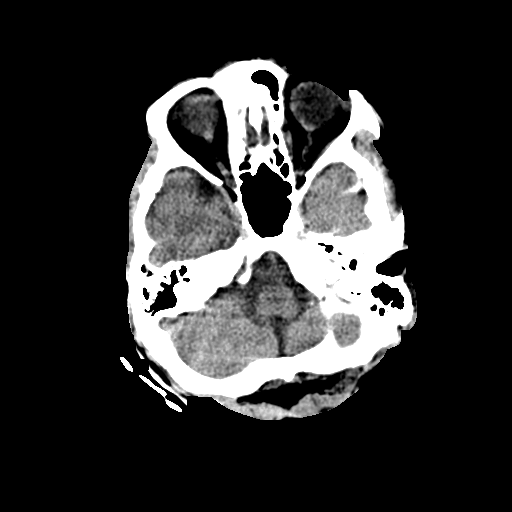
[im 7/32  brain]
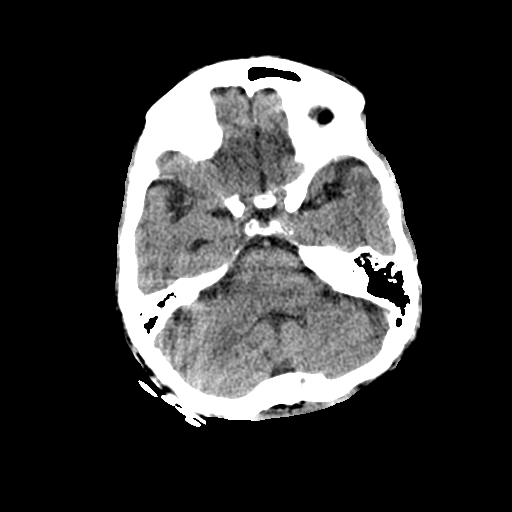
[im 10/32  brain]
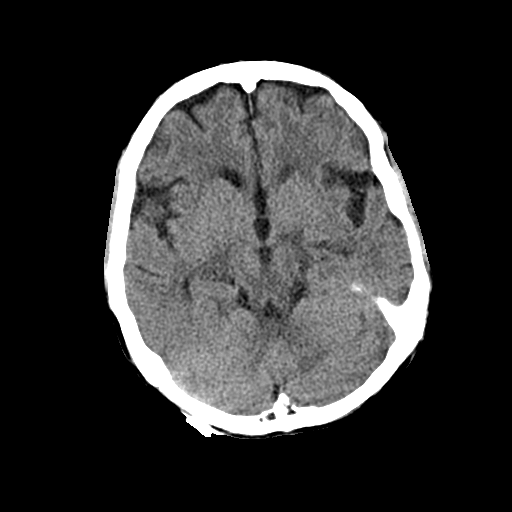
[im 10/32  bone]
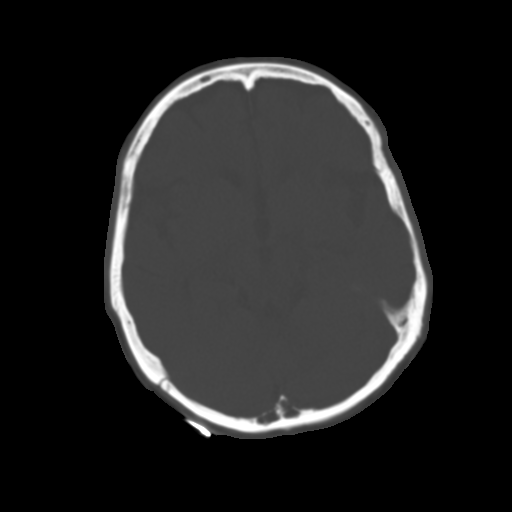
[im 12/32  brain]
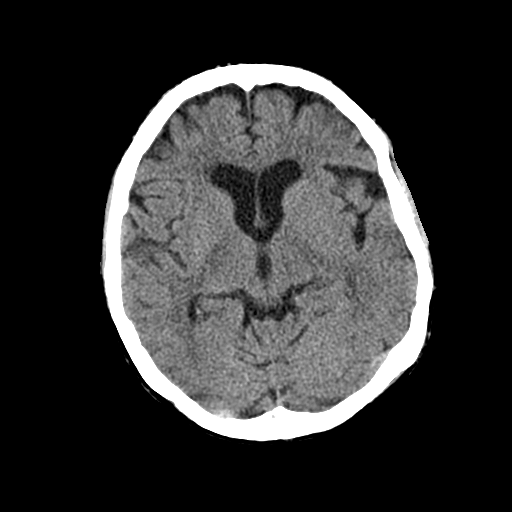
[im 14/32  brain]
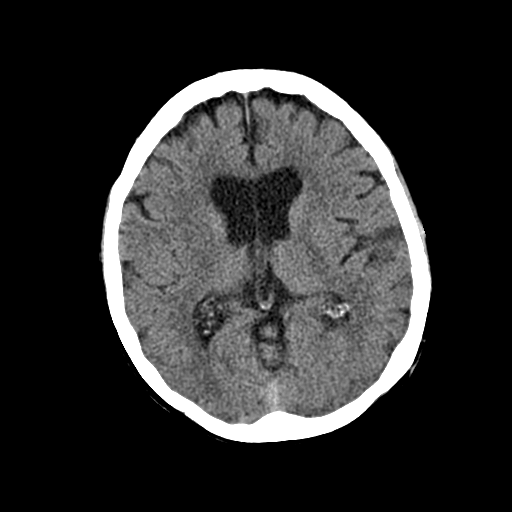
[im 15/32  brain]
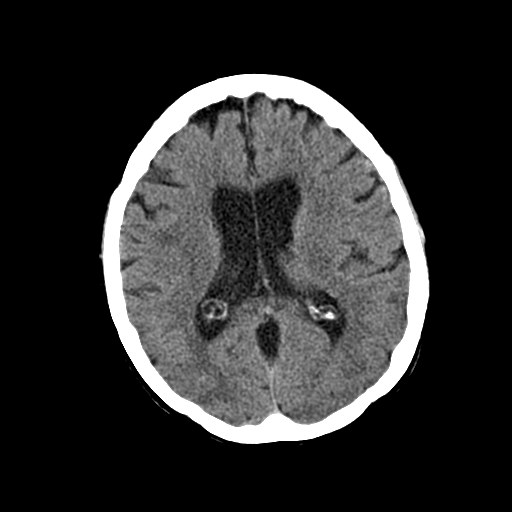
[im 17/32  brain]
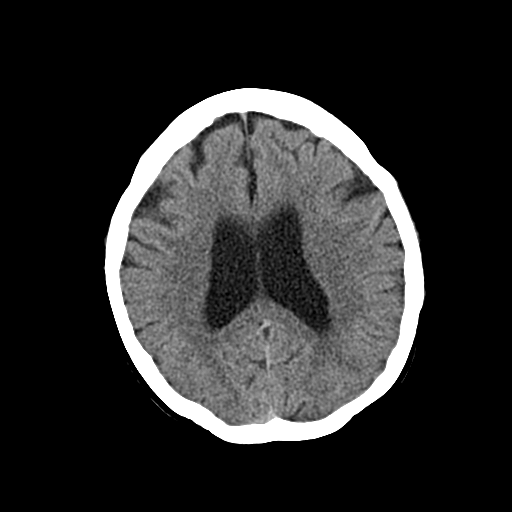
[im 17/32  bone]
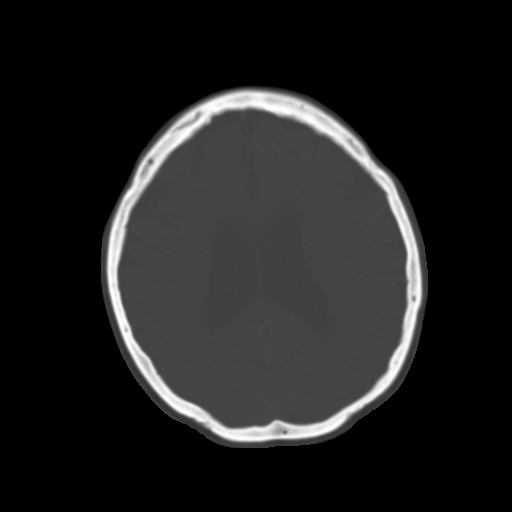
[im 18/32  brain]
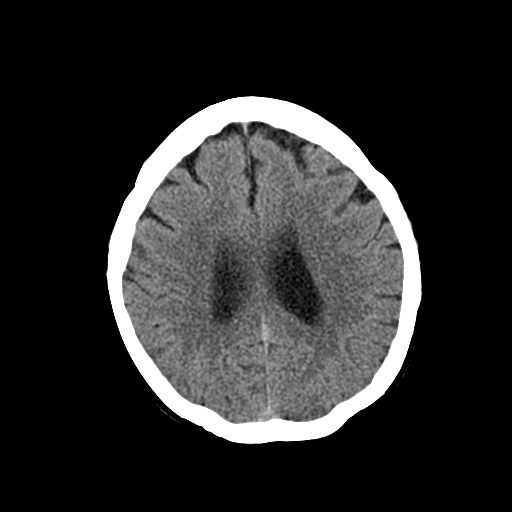
[im 20/32  brain]
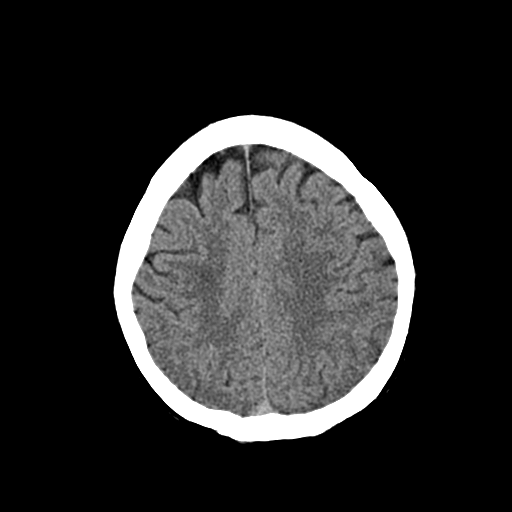
[im 22/32  brain]
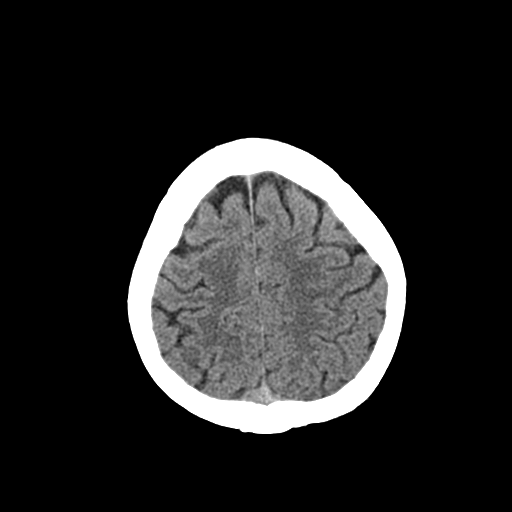
[im 25/32  brain]
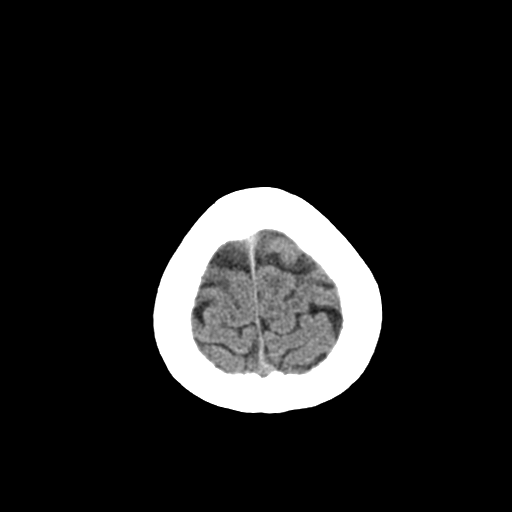
[im 25/32  bone]
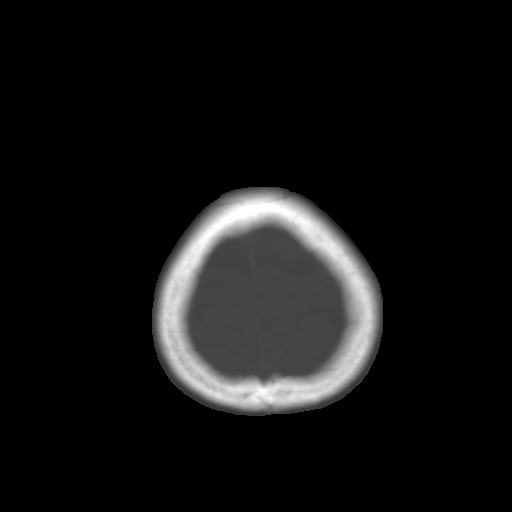
[im 27/32  brain]
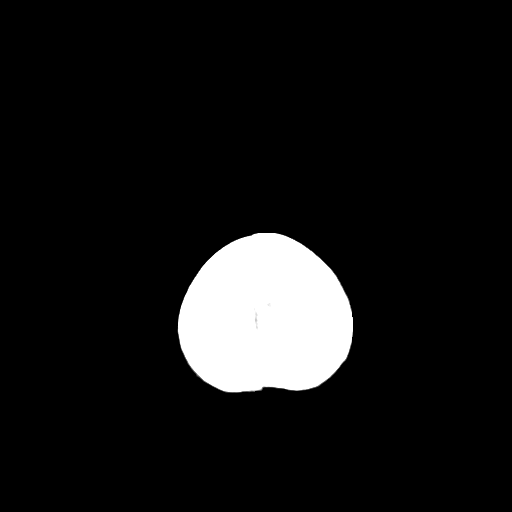
[im 28/32  brain]
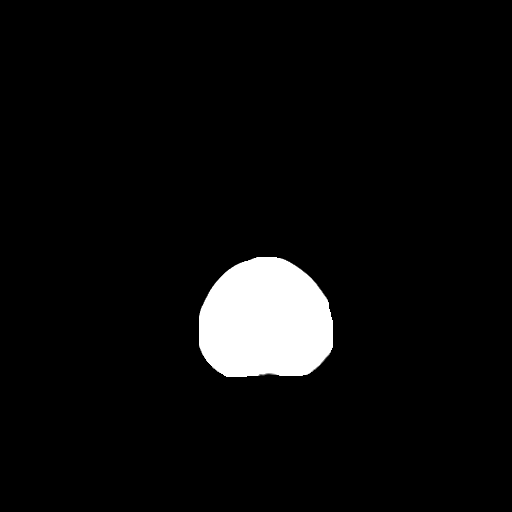
[im 30/32  brain]
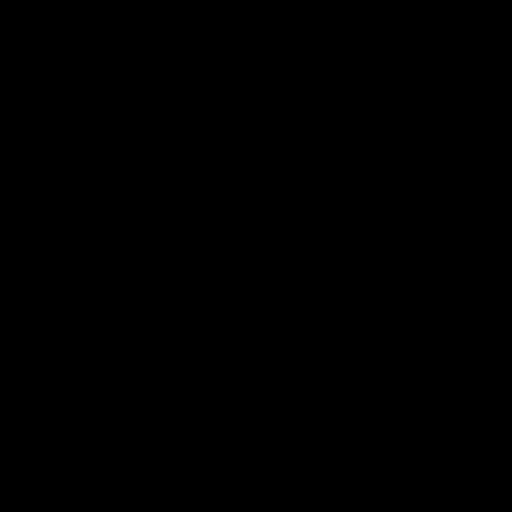

[16 of 30 positions shown; findings below may reference images not displayed]

FINDINGS: Mild cerebral atrophy. Ventricular dilatation likely due to central
atrophy. Minimal patchy low-attenuation changes in the deep white
matter consistent with small vessel ischemia. No mass effect or
midline shift. No abnormal extra-axial fluid collections. Gray-white
matter junctions are distinct. Basal cisterns are not effaced. No
evidence of acute intracranial hemorrhage. No depressed skull
fractures. Visualized paranasal sinuses and mastoid air cells are
not opacified.
IMPRESSION: No acute intracranial abnormalities. Chronic atrophy and small
vessel ischemic changes.

## 2016-05-24 ENCOUNTER — Encounter: Payer: Self-pay | Admitting: Vascular Surgery

## 2016-05-30 ENCOUNTER — Encounter: Payer: Self-pay | Admitting: Vascular Surgery

## 2016-05-30 ENCOUNTER — Ambulatory Visit (INDEPENDENT_AMBULATORY_CARE_PROVIDER_SITE_OTHER): Payer: Medicare Other | Admitting: Vascular Surgery

## 2016-05-30 VITALS — BP 148/66 | HR 73 | Temp 97.0°F | Resp 18 | Ht 62.0 in | Wt 141.0 lb

## 2016-05-30 DIAGNOSIS — I8393 Asymptomatic varicose veins of bilateral lower extremities: Secondary | ICD-10-CM | POA: Diagnosis not present

## 2016-05-30 NOTE — Progress Notes (Signed)
Subjective:     Patient ID: Christina Hoover, female   DOB: 06/02/1936, 80 y.o.   MRN: [id number]  HPI This 80 year old female is referred by Dr. Amy Martinique for evaluation of spider veins. Patient has had broken veins in both legs for many years which have become quite prominent in the lower third of both legs particularly on the right side. She has no history of DVT thrombophlebitis stasis ulcers or bleeding. Not develops swelling is a day progresses nor elastic compression stockings. She is concerned about the appearance of these.  Past Medical History:  Diagnosis Date  . Anxiety   . Asthma   . Atrophic vaginitis   . Atypical mycobacterial infection   . Depression   . Diverticulosis of colon (without mention of hemorrhage) 08-19-2007   Colonoscopy   . Epiretinal membrane   . Female bladder prolapse   . GERD (gastroesophageal reflux disease)   . Headache(784.0)   . Hiatal hernia 08-19-2007   EGD  . History of colonic polyps 08-19-2007   HYPERPLASTIC POLYP  . Hyperlipidemia   . Hypertension   . Myalgia and myositis, unspecified   . Urge urinary incontinence 05/11/14  . Urinary, incontinence, stress female 05/11/14    Social History  Substance Use Topics  . Smoking status: Never Smoker  . Smokeless tobacco: Never Used  . Alcohol use No    Family History  Problem Relation Age of Onset  . Hypertension Mother   . Heart failure Mother   . Stroke Father 56  . Heart disease Father   . Hypertension Sister   . Hypertension Brother   . Breast cancer Maternal Aunt   . Breast cancer Paternal Aunt     Allergies  Allergen Reactions  . Lisinopril     REACTION: hacky cough     Current Outpatient Prescriptions:  .  amLODipine (NORVASC) 5 MG tablet, Take 1 tablet (5 mg total) by mouth daily., Disp: 90 tablet, Rfl: 1 .  Ascorbic Acid (VITAMIN C) 500 MG tablet, Take 500 mg by mouth daily.  , Disp: , Rfl:  .  aspirin 81 MG chewable tablet, Chew by mouth 3 (three) times a week., Disp: ,  Rfl:  .  buPROPion (WELLBUTRIN SR) 150 MG 12 hr tablet, Take 1 tablet (150 mg total) by mouth daily., Disp: 90 tablet, Rfl: 2 .  estrogens, conjugated, (PREMARIN) 0.625 MG tablet, Take 0.625 mg by mouth daily. Take daily for 21 days then do not take for 7 days., Disp: , Rfl:  .  fish oil-omega-3 fatty acids 1000 MG capsule, Take 2 g by mouth daily., Disp: , Rfl:  .  fluocinonide gel (LIDEX) [id number] %, Apply 1 application topically 2 (two) times daily., Disp: , Rfl:  .  FLUoxetine (PROZAC) 20 MG capsule, Take 1 capsule (20 mg total) by mouth daily., Disp: 90 capsule, Rfl: 2 .  Fluticasone-Salmeterol (ADVAIR DISKUS) 250-50 MCG/DOSE AEPB, Inhale 1 puff into the lungs daily. 1 puff daily, Disp: 180 each, Rfl: 3 .  metoCLOPramide (REGLAN) 10 MG tablet, Takes 1/2 to 1 tablet by mouth daily as needed for bloating, Disp: 90 tablet, Rfl: 0 .  Omega-3 Fatty Acids (FISH OIL) 1000 MG CAPS, Take by mouth., Disp: , Rfl:  .  omeprazole (PRILOSEC) 20 MG capsule, Take by mouth., Disp: , Rfl:  .  zolpidem (AMBIEN) 10 MG tablet, Take 10 mg by mouth at bedtime as needed for sleep., Disp: , Rfl:  .  dexlansoprazole (DEXILANT) 60 MG capsule, Take by  mouth., Disp: , Rfl:  .  pregabalin (LYRICA) 150 MG capsule, Take 1 capsule (150 mg total) by mouth 3 (three) times daily., Disp: 270 capsule, Rfl: 1  Vitals:   05/30/16 1439 05/30/16 1443  BP: (!) 146/62 (!) 148/66  Pulse: 73   Resp: 18   Temp: 97 F (36.1 C)   TempSrc: Oral   SpO2: 95%   Weight: 141 lb (64 kg)   Height: 5\' 2"  (1.575 m)     Body mass index is 25.79 kg/m.         Review of Systems Denies chest pain but does have mild dyspnea on exertion. Has history of right leg paralysis following back-spine surgery which required extensive rehabilitation postoperatively.    Objective:   Physical Exam BP (!) 148/66 Comment: RECHECK  Pulse 73   Temp 97 F (36.1 C) (Oral)   Resp 18   Ht 5\' 2"  (1.575 m)   Wt 141 lb (64 kg)   SpO2 95%   BMI 25.79  kg/m     Gen.-alert and oriented x3 in no apparent distress HEENT normal for age Lungs no rhonchi or wheezing Cardiovascular regular rhythm no murmurs carotid pulses 3+ palpable no bruits audible Abdomen soft nontender no palpable masses Musculoskeletal free of  major deformities Skin clear -no rashes Neurologic normal Lower extremities 3+ femoral and dorsalis pedis pulses palpable bilaterally with no edema Both legs with diffuse spider veins and medial and lateral thighs and lower third of legs on the right side a cluster anterior aspect about 5 or 6 cm proximal to the malleolar. Both feet well perfused. Varicosities noted.  Today I performed a bedside SonoSite ultrasound exam which revealed normal sized great saphenous veins bilaterally with no obvious reflux.       Assessment:     #1 bilateral spider veins-asymptomatic    Plan:     #1 discussed with patient the treatment would consist of foam sclerotherapy. Also discussed with her realistic expectations for cosmetic improvement Christina Hoover -vein nurse-further discussed this procedure with patient and she will decide if she would like to proceed

## 2016-05-31 ENCOUNTER — Ambulatory Visit (INDEPENDENT_AMBULATORY_CARE_PROVIDER_SITE_OTHER): Payer: Self-pay | Admitting: *Deleted

## 2016-05-31 DIAGNOSIS — I8393 Asymptomatic varicose veins of bilateral lower extremities: Secondary | ICD-10-CM

## 2016-05-31 NOTE — Progress Notes (Signed)
X=.3% Sotradecol administered with a 27g butterfly.  Patient received a total of 6cc.  Treated all areas of concern. Easy access. Tol well. Anticipate good results. Follow prn.    Compression stockings applied: Yes.    Knee high on right and thigh high on left.

## 2017-03-07 ENCOUNTER — Encounter: Payer: Self-pay | Admitting: Internal Medicine

## 2017-03-23 ENCOUNTER — Telehealth: Payer: Self-pay

## 2017-03-23 NOTE — Telephone Encounter (Signed)
We got a cologuard recall notice that patient may be due for another cologuard test.  I checked with Dr Carlean Purl and he said no recall needed due to her age.  Her previous result was negative.

## 2017-05-07 ENCOUNTER — Other Ambulatory Visit: Payer: Self-pay

## 2017-05-19 DEATH — deceased
